# Patient Record
Sex: Female | Born: 1970 | Race: Black or African American | Hispanic: No | Marital: Married | Smoking: Never smoker
Health system: Southern US, Community
[De-identification: ages and names within clinical notes are randomized; demographics above are authoritative.]

## PROBLEM LIST (undated history)

## (undated) DIAGNOSIS — F419 Anxiety disorder, unspecified: Secondary | ICD-10-CM

## (undated) DIAGNOSIS — E079 Disorder of thyroid, unspecified: Secondary | ICD-10-CM

## (undated) DIAGNOSIS — G459 Transient cerebral ischemic attack, unspecified: Secondary | ICD-10-CM

---

## 2012-08-07 ENCOUNTER — Ambulatory Visit (INDEPENDENT_AMBULATORY_CARE_PROVIDER_SITE_OTHER): Payer: BC Managed Care – PPO | Admitting: Obstetrics and Gynecology

## 2012-08-07 ENCOUNTER — Encounter: Payer: Self-pay | Admitting: Obstetrics and Gynecology

## 2012-08-07 VITALS — BP 120/84 | HR 64 | Ht 71.0 in | Wt 190.0 lb

## 2012-08-07 DIAGNOSIS — Z124 Encounter for screening for malignant neoplasm of cervix: Secondary | ICD-10-CM

## 2012-08-07 DIAGNOSIS — A499 Bacterial infection, unspecified: Secondary | ICD-10-CM

## 2012-08-07 DIAGNOSIS — N760 Acute vaginitis: Secondary | ICD-10-CM

## 2012-08-07 DIAGNOSIS — Z01419 Encounter for gynecological examination (general) (routine) without abnormal findings: Secondary | ICD-10-CM

## 2012-08-07 MED ORDER — METRONIDAZOLE 0.75 % VA GEL: 1.0000 | Freq: Every day | VAGINAL | Status: DC

## 2012-08-07 NOTE — Progress Notes (Signed)
ANNUAL GYNECOLOGIC EXAMINATION   Virginia Griffith is a 41 y.o. female, G8P3, who presents for an annual exam. She has a history of recurrent vaginosis.    History   Social History  . Marital Status: Married    Spouse Name: N/A    Number of Children: N/A  . Years of Education: N/A   Social History Main Topics  . Smoking status: Never Smoker   . Smokeless tobacco: None  . Alcohol Use: Yes     wine socially   . Drug Use: No  . Sexually Active: None   Other Topics Concern  . None   Social History Narrative  . None    Menstrual cycle:   LMP: Patient's last menstrual period was 07/23/2012.             The following portions of the patient's history were reviewed and updated as appropriate: allergies, current medications, past family history, past medical history, past social history, past surgical history and problem list.  Review of Systems Pertinent items are noted in HPI. Breast:Negative for breast lump,nipple discharge or nipple retraction Gastrointestinal: Negative for abdominal pain, change in bowel habits or rectal bleeding Urinary:negative   Objective:    BP 120/84  Pulse 64  Ht 5\' 11"  (1.803 m)  Wt 190 lb (86.183 kg)  BMI 26.50 kg/m2  LMP 07/23/2012    Weight:  Wt Readings from Last 1 Encounters:  08/07/12 190 lb (86.183 kg)          BMI: Body mass index is 26.50 kg/(m^2).  General Appearance: Alert, appropriate appearance for age. No acute distress HEENT: Grossly normal Neck / Thyroid: Supple, no masses, nodes or enlargement Lungs: clear to auscultation bilaterally Back: No CVA tenderness Breast Exam: No masses or nodes.No dimpling, nipple retraction or discharge. Cardiovascular: Regular rate and rhythm. S1, S2, no murmur Gastrointestinal: Soft, non-tender, no masses or organomegaly  ++++++++++++++++++++++++++++++++++++++++++++++++++++++++  Pelvic Exam: External genitalia: normal general appearance Vaginal: normal without tenderness, induration or  masses. Relaxation: Yes Cervix: normal appearance Adnexa: normal bimanual exam Uterus: normal size, shape, and consistency Rectovaginal: normal rectal, no masses  ++++++++++++++++++++++++++++++++++++++++++++++++++++++++  Lymphatic Exam: Non-palpable nodes in neck, clavicular, axillary, or inguinal regions Neurologic: Normal speech, no tremor  Psychiatric: Alert and oriented, appropriate affect.   Wet prep: Negative  Assessment:    Normal gyn exam   Overweight or obese: Yes   Pelvic relaxation: Yes  Recurrent vaginosis   Plan:    mammogram pap smear return annually or prn Contraception:no method    Medications prescribed: MetroGel vaginal one applicator each night for 5 nights as needed around the time of her menstrual cycle  STD screen request: No   The updated Pap smear screening guidelines were discussed with the patient. The patient requested that I obtain a Pap smear: Yes.  Kegel exercises discussed: Yes.  Proper diet and regular exercise were reviewed.  Annual mammograms recommended starting at age 27. Proper breast care was discussed.  Screening colonoscopy is recommended beginning at age 21.  Regular health maintenance was reviewed.  Sleep hygiene was discussed.  Adequate calcium and vitamin D intake was emphasized.  Leonard Schwartz M.D.    Regular Periods: yes, odor after cycle is over  Mammogram: no  Monthly Breast Ex.: yes Exercise: not since injury  Tetanus < 10 years: no unsure Seatbelts: yes  NI. Bladder Functn.: yes Abuse at home: no  Daily BM's: no Stressful Work: yes sometimes  Healthy Diet: yes Sigmoid-Colonoscopy: n/a  Calcium: no  Medical problems this year: dislocated ankle, torn ligaments   LAST PAP: 01/24/10  Contraception: none  Mammogram:  Pt has not had one  PCP:  none  PMH:  none  FMH: none  Last Bone Scan: n/a

## 2012-08-08 LAB — PAP IG W/ RFLX HPV ASCU

## 2012-11-29 ENCOUNTER — Other Ambulatory Visit: Payer: Self-pay

## 2013-08-20 ENCOUNTER — Other Ambulatory Visit: Payer: Self-pay

## 2014-08-16 ENCOUNTER — Encounter: Payer: Self-pay | Admitting: Obstetrics and Gynecology

## 2014-09-20 ENCOUNTER — Emergency Department (HOSPITAL_COMMUNITY)
Admission: EM | Admit: 2014-09-20 | Discharge: 2014-09-20 | Disposition: A | Discharge status: Home / Self Care | Payer: BC Managed Care – PPO | Attending: Emergency Medicine | Admitting: Emergency Medicine

## 2014-09-20 ENCOUNTER — Encounter (HOSPITAL_COMMUNITY): Payer: Self-pay | Admitting: *Deleted

## 2014-09-20 DIAGNOSIS — Z91040 Latex allergy status: Secondary | ICD-10-CM | POA: Insufficient documentation

## 2014-09-20 DIAGNOSIS — F410 Panic disorder [episodic paroxysmal anxiety] without agoraphobia: Secondary | ICD-10-CM | POA: Insufficient documentation

## 2014-09-20 HISTORY — DX: Anxiety disorder, unspecified: F41.9

## 2014-09-20 MED ORDER — LORAZEPAM 1 MG PO TABS
1.0000 mg | ORAL_TABLET | Freq: Once | ORAL | Status: AC
  Administered 2014-09-20: 1 mg via ORAL
  Filled 2014-09-20: qty 1

## 2014-09-20 NOTE — Discharge Instructions (Signed)
Panic Attacks °Panic attacks are sudden, short-lived surges of severe anxiety, fear, or discomfort. They may occur for no reason when you are relaxed, when you are anxious, or when you are sleeping. Panic attacks may occur for a number of reasons:  °· Healthy people occasionally have panic attacks in extreme, life-threatening situations, such as war or natural disasters. Normal anxiety is a protective mechanism of the body that helps us react to danger (fight or flight response). °· Panic attacks are often seen with anxiety disorders, such as panic disorder, social anxiety disorder, generalized anxiety disorder, and phobias. Anxiety disorders cause excessive or uncontrollable anxiety. They may interfere with your relationships or other life activities. °· Panic attacks are sometimes seen with other mental illnesses, such as depression and posttraumatic stress disorder. °· Certain medical conditions, prescription medicines, and drugs of abuse can cause panic attacks. °SYMPTOMS  °Panic attacks start suddenly, peak within 20 minutes, and are accompanied by four or more of the following symptoms: °· Pounding heart or fast heart rate (palpitations). °· Sweating. °· Trembling or shaking. °· Shortness of breath or feeling smothered. °· Feeling choked. °· Chest pain or discomfort. °· Nausea or strange feeling in your stomach. °· Dizziness, light-headedness, or feeling like you will faint. °· Chills or hot flushes. °· Numbness or tingling in your lips or hands and feet. °· Feeling that things are not real or feeling that you are not yourself. °· Fear of losing control or going crazy. °· Fear of dying. °Some of these symptoms can mimic serious medical conditions. For example, you may think you are having a heart attack. Although panic attacks can be very scary, they are not life threatening. °DIAGNOSIS  °Panic attacks are diagnosed through an assessment by your health care provider. Your health care provider will ask  questions about your symptoms, such as where and when they occurred. Your health care provider will also ask about your medical history and use of alcohol and drugs, including prescription medicines. Your health care provider may order blood tests or other studies to rule out a serious medical condition. Your health care provider may refer you to a mental health professional for further evaluation. °TREATMENT  °· Most healthy people who have one or two panic attacks in an extreme, life-threatening situation will not require treatment. °· The treatment for panic attacks associated with anxiety disorders or other mental illness typically involves counseling with a mental health professional, medicine, or a combination of both. Your health care provider will help determine what treatment is best for you. °· Panic attacks due to physical illness usually go away with treatment of the illness. If prescription medicine is causing panic attacks, talk with your health care provider about stopping the medicine, decreasing the dose, or substituting another medicine. °· Panic attacks due to alcohol or drug abuse go away with abstinence. Some adults need professional help in order to stop drinking or using drugs. °HOME CARE INSTRUCTIONS  °· Take all medicines as directed by your health care provider.   °· Schedule and attend follow-up visits as directed by your health care provider. It is important to keep all your appointments. °SEEK MEDICAL CARE IF: °· You are not able to take your medicines as prescribed. °· Your symptoms do not improve or get worse. °SEEK IMMEDIATE MEDICAL CARE IF:  °· You experience panic attack symptoms that are different than your usual symptoms. °· You have serious thoughts about hurting yourself or others. °· You are taking medicine for panic attacks and   have a serious side effect. °MAKE SURE YOU: °· Understand these instructions. °· Will watch your condition. °· Will get help right away if you are not  doing well or get worse. °Document Released: 10/01/2005 Document Revised: 10/06/2013 Document Reviewed: 05/15/2013 °ExitCare® Patient Information ©2015 [employer]. This information is not intended to replace advice given to you by your health care provider. Make sure you discuss any questions you have with your health care provider. ° °Emergency Department Resource Guide °1) Find a Doctor and Pay Out of Pocket °Although you won't have to find out who is covered by your insurance plan, it is a good idea to ask around and get recommendations. You will then need to call the office and see if the doctor you have chosen will accept you as a new patient and what types of options they offer for patients who are self-pay. Some doctors offer discounts or will set up payment plans for their patients who do not have insurance, but you will need to ask so you aren't surprised when you get to your appointment. ° °2) Contact Your Local Health Department °Not all health departments have doctors that can see patients for sick visits, but many do, so it is worth a call to see if yours does. If you don't know where your local health department is, you can check in your phone book. The CDC also has a tool to help you locate your state's health department, and many state websites also have listings of all of their local health departments. ° °3) Find a Walk-in Clinic °If your illness is not likely to be very severe or complicated, you may want to try a walk in clinic. These are popping up all over the country in pharmacies, drugstores, and shopping centers. They're usually staffed by nurse practitioners or physician assistants that have been trained to treat common illnesses and complaints. They're usually fairly quick and inexpensive. However, if you have serious medical issues or chronic medical problems, these are probably not your best option. ° °No Primary Care Doctor: °- Call [facility] at  832-8000 - they can help you  locate a primary care doctor that  accepts your insurance, provides certain services, etc. °- Physician Referral Service- 1-800-533-3463 ° °Chronic Pain Problems: °Organization         Address  Phone   Notes  °[facility]  (336) 297-2271 Patients need to be referred by their primary care doctor.  ° °Medication Assistance: °Organization         Address  Phone   Notes  °[place] [facility] [address] °[place], [place] [address] (336) 641-8030 --Must be a resident of [place] °-- Must have NO insurance coverage whatsoever (no Medicaid/ Medicare, etc.) °-- The pt. MUST have a primary care doctor that directs their care regularly and follows them in the community °  °[facility]  (866) 331-1348   °[employer]  (888) 892-1162   ° °Agencies that provide inexpensive medical care: °Organization         Address  Phone   Notes  °[facility]  (336) 832-8035   °[facility]    (336) 832-7272   °[facility] [address] °[place], [place] [address] (336) 832-4777   °[facility] [address], °[place] (336) 271-4999   °[facility]    (336) 373-0678   °[facility]    (336) 272-1050   °  [facility] ° [address], [place] Phone:  (336) 832-4444, Fax:  (336) 832-4440 Hours of Operation:  9 am - 6 pm, M-F.  Also accepts Medicaid/Medicare and self-pay.  °[facility] ° [address], [place] Phone: (336) [phone], Fax: (336) [phone]. Hours of Operation:  8:30 am - 5:30 pm, M-F.  Also accepts Medicaid and self-pay.  °[facility] [place] [address], [place] Phone: (336) 878-6027   °[facility] [address], [place], [place] (336)723-1848, Ext. 123 Mondays & Thursdays: 7-9 AM.  First 15 patients are seen on a first come, first serve basis. °  ° °Medicaid-accepting [place]  Providers: ° °Organization         Address  Phone   Notes  °[facility] [address], [place] (336) 641-2100 Also accepts self-pay patients.  °[facility] [address], [place] ° (336) 856-9996   °[facility] [address], [place] (336) 288-8857   °[facility] [address] [place] [address], [place] (336) 299-7000   °Veita Bland [address], [place]  ° (336) 373-1557 Only accepts [place] Access Medicaid patients after they have their name applied to their card.  ° °Self-Pay (no insurance) in [place]: ° °Organization         Address  Phone   Notes  °Sickle Cell Patients, [facility] [address], [place] (336) 832-1970   °[facility] [address], [place] (336) 832-4400   °[facility] ° [address], [place] (336) 992-4800   °[facility]/Dr. Osei-Bonsu ° [address] [place] [address], [place] or [address], [place] (336) [phone] Phone number for both [place] and [place] locations is the same.  °[facility] [address], [place] (336) 299-0000   °[facility] [address] [place] [address], [place] or [address] (336) 852-7530 °(336) 878-2260   °[facility] [address], [place] (336) 350-1642, phone; (336) 294-5005, fax Sees patients 1st and 3rd Saturday of every month.  Must not qualify for public or private insurance (i.e. Medicaid, Medicare, [place] Health Choice, Veterans' Benefits) • Household income should be no more than 200% of the poverty level •The clinic cannot treat you if you are pregnant or think you are pregnant • Sexually transmitted diseases are not treated at the clinic.  ° ° °Dental Care: °Organization         Address  Phone  Notes  °[facility] [address], [place] (336) 641-6152 Accepts children up to age 21 who are enrolled in Medicaid or [place] Health Choice; pregnant women with a Medicaid card; and children who have applied for Medicaid or [place] Health Choice, but were declined, whose parents can pay a reduced fee at time of service.  °[facility] [place]  [address], [place] (336) 641-7733 Accepts children up to age 21 who are enrolled in Medicaid or [place] Health Choice; pregnant women with a Medicaid card; and children who have applied for Medicaid or [place] Health Choice, but were declined, whose parents can pay a reduced fee at time of service.  °[facility] ° [address], [place] (336) 641-4533 Patients are seen by appointment only. Walk-ins are not accepted. [facility] will see patients 18 years of age and older. °Monday - Tuesday (8am-5pm) °Most Wednesdays (8:30-5pm) °$30 per visit, cash only  °[facility] ° [address], [place] (336) 641-4533 Patients are seen by appointment only. Walk-ins are not accepted. [facility] will see patients 18 years of age and older. °One Wednesday Evening (Monthly: Volunteer Based).  $30 per visit, cash only  °[facility]  (919) 537-3737 for adults; Children under age 4, call Graduate Pediatric Dentistry at ([phone]) [phone]. Children aged 4-14, please call ([phone]) [phone] to request a pediatric application. ° Dental services are provided in all areas of dental care including fillings, crowns and bridges, complete and partial dentures, implants, gum treatment, root canals, and extractions. Preventive care is also provided. Treatment is provided to both adults and children. °Patients are selected via a lottery and there is often a waiting list. °  °[facility] [address], °[place] ° (336) 763-8833 [email or web address] °  °[facility]  [address], [place], [place] (336)723-1848, Ext. 123 Second and Fourth Thursday of each month, opens at 6:30 AM; Clinic ends at 9 AM.  Patients are seen on a first-come first-served basis, and a limited number are seen during each clinic.  ° °[facility] ° [address], [place], [place] (336) 723-7904   Eligibility Requirements °You must have lived in [place], [place], or [place] counties for at least the last three months. °  You cannot be eligible for state or federal sponsored healthcare insurance, including Veterans Administration, Medicaid, or Medicare. °  You generally cannot be eligible for healthcare insurance through your employer.  °  How to apply: °Eligibility screenings are held every Tuesday and Wednesday afternoon from 1:00 pm until 4:00 pm. You do not need an appointment for the interview!  °[facility] [address], [place], [place] 336-631-2330   °[facility]  336-342-8273   °[place] [facility]  336-703-3100   °[facility]  336-570-6415   ° °Behavioral Health Resources in the Community: °Intensive Outpatient Programs °Organization         Address  Phone  Notes  °[facility] [address], [place], [place] 336-878-6098   °[facility] [address], [place], [place] 336-832-9800   °[facility] [address], [place], [place] ° 336-882-2125   °[facility] [address],  °[place], [place] 1-800-853-5163 or 336-641-4981   °Substance Abuse Resources °Organization         Address  Phone  Notes  °[facility]  336-882-2125   °[facility]  336-784-9470   °[facility]  336-285-9073   °[facility]  336-845-3988   °Residential & Outpatient Substance Abuse Program  1-800-659-3381   °Psychological Services °Organization         Address  Phone  Notes  °[facility]  336- 832-9600     °[facility]  336- 378-7881   °[facility] [address], [place] 1-800-853-5163 or 336-641-4981   ° °Mobile Crisis Teams °Organization         Address  Phone  Notes  °[facility], Mobile Crisis Care Unit  1-877-626-1772   °Assertive °[facility] ° [address] , [place] 336-834-9664   °  Sharon DeEsch [address] °[place] 336-554-5454   ° °Self-Help/Support Groups °Organization         Address  Phone             Notes  °[facility] - variety of support groups  336- 373-1402 Call for more information  °Narcotics Anonymous (NA), [facility] [address], °[place]  2 meetings at this location  ° °Residential Treatment Programs °Organization         Address  Phone  Notes  °[facility] [address],    °[place]  1-866-801-8205   °[facility] ° [address], [place], [place] 704-293-8524   °[facility] [address], [place] 336-845-3988 Admissions: 8am-3pm M-F  °[facility] [address]    °[place], [place] 336-841-1104   °[facility] [address], [place], [place] 336-379-7146   °[facility] [address]  °[place], [place] 336-285-9073   °[facility] - Intensive Outpatient [address], [place], [place] 336-852-3033   °[facility] [address]  °[place], [place] 1-877-615-2722 or 336-784-9470   °[facility] [address] [place], [place] 336-227-7417 Accepts Medicaid  °[facility] [address]  °[place] 1-800-659-3381 Substance Abuse/Addiction Treatment  ° °[place] Behavioral Health Resources °Organization         Address  Phone  Notes  °[facility]  (888) 581-9988   °Julie Brannon, PhD [address] [place], [place]   (336) 349-5553 or (336) 951-0000    °[facility]   [address] °[place], [place] (336) 349-4454   °[facility] [address], [place], [place] (336) 342-8316 Insurance/Medicaid/sponsorship through [facility]  °[facility] [address]                                    [place], [place] (336) 342-8316 Therapy/tele-psych/case  °[facility] [address]  ° [place], [place] (336) 349-2233    °Dr. Arfeen  (336) 349-4544   °[facility] [place]  [employer] [place] [facility] 1) [address],  °2) [address], [place] °3)  [address], [place] (336) 349-3220 °(336) 342-7768 ° °(336) 342-8140   °[place] [facility] (336) 342-1394 or (336) 342-3537 (After Hours)    ° ° ° °

## 2014-09-20 NOTE — ED Provider Notes (Signed)
CSN: [id number]     Arrival date & time 09/20/14  [id number] History   First MD Initiated Contact with Patient 09/20/14 0243     Chief Complaint  Patient presents with  . Panic Attack     (Consider location/radiation/quality/duration/timing/severity/associated sxs/prior Treatment) Patient is a 43 y.o. female presenting with shortness of breath.  Shortness of Breath Severity:  Severe Onset quality:  Sudden Duration:  1 hour Timing:  Constant Progression:  Resolved Chronicity:  Recurrent Context: emotional upset (recent stress)   Relieved by:  Nothing Worsened by:  Nothing tried Associated symptoms: no abdominal pain, no chest pain, no cough, no fever, no hemoptysis and no vomiting     Past Medical History  Diagnosis Date  . Anxiety    History reviewed. No pertinent past surgical history. History reviewed. No pertinent family history. History  Substance Use Topics  . Smoking status: Never Smoker   . Smokeless tobacco: Not on file  . Alcohol Use: Yes     Comment: wine socially    OB History    Gravida Para Term Preterm AB TAB SAB Ectopic Multiple Living   8 3        3      Review of Systems  Constitutional: Negative for fever.  Respiratory: Positive for shortness of breath. Negative for cough and hemoptysis.   Cardiovascular: Negative for chest pain.  Gastrointestinal: Negative for vomiting and abdominal pain.  All other systems reviewed and are negative.     Allergies  Latex  Home Medications   Prior to Admission medications   Medication Sig Start Date End Date Taking? Authorizing Provider  metroNIDAZOLE (METROGEL VAGINAL) 0.75 % vaginal gel Place 1 Applicatorful vaginally daily. Patient not taking: Reported on 09/20/2014 08/07/12   Kirkland HunArthur Stringer, MD   BP 135/95 mmHg  Pulse 86  Resp 20  Ht 5\' 11"  (1.803 m)  Wt 185 lb (83.915 kg)  BMI 25.81 kg/m2  SpO2 100%  LMP 09/15/2014 Physical Exam  Constitutional: She is oriented to person, place, and time. She  appears well-developed and well-nourished.  HENT:  Head: Normocephalic and atraumatic.  Right Ear: External ear normal.  Left Ear: External ear normal.  Eyes: Conjunctivae and EOM are normal. Pupils are equal, round, and reactive to light.  Neck: Normal range of motion. Neck supple.  Cardiovascular: Normal rate, regular rhythm, normal heart sounds and intact distal pulses.   Pulmonary/Chest: Effort normal and breath sounds normal.  Abdominal: Soft. Bowel sounds are normal. There is no tenderness.  Musculoskeletal: Normal range of motion.  Neurological: She is alert and oriented to person, place, and time.  Skin: Skin is warm and dry.  Vitals reviewed.   ED Course  Procedures (including critical care time) Labs Review Labs Reviewed - No data to display  Imaging Review No results found.   EKG Interpretation None      MDM   Final diagnoses:  Panic attack    43 y.o. female with pertinent PMH of panic attacks presents with what she describes as a panic attack. Patient states symptoms are identical. She has recent increased life stressors, as well as starting a lemonade diet which involves numerous liquids, no solid food. On arrival the patient has vital signs and physical exam as above which are unremarkable. She has no symptoms. I repeatedly discussed if there is any difference between prior panic attacks, any other concerning historical features for other pathology. The patient repeatedly denied any new symptoms. We discussed that if she were feeling poorly  from her diet, she should discontinue this. Discussed obtaining labs, however we shared decision-making agreed that since patient had identical symptoms to a panic attack and no persistent symptoms, did not feel further workup warranted. The patient will follow up with her primary care doctor. She was given Ativan prior to discharge..    1. Panic attack         Mirian MoMatthew Gentry, MD 09/20/14 [phone]

## 2014-09-20 NOTE — ED Notes (Signed)
Pt in stating that she woke up feeling anxious, intermittent panic attacks, history of same, also states she has been on a "lemonade" diet for three days and that may be contributing to symptoms, states she has a lot going on right now that is causing stress

## 2014-12-15 ENCOUNTER — Encounter (HOSPITAL_COMMUNITY): Payer: Self-pay | Admitting: Emergency Medicine

## 2014-12-15 ENCOUNTER — Emergency Department (HOSPITAL_COMMUNITY): Payer: BC Managed Care – PPO

## 2014-12-15 ENCOUNTER — Emergency Department (HOSPITAL_COMMUNITY)
Admission: EM | Admit: 2014-12-15 | Discharge: 2014-12-15 | Disposition: A | Discharge status: Home / Self Care | Payer: BC Managed Care – PPO | Attending: Emergency Medicine | Admitting: Emergency Medicine

## 2014-12-15 DIAGNOSIS — S161XXA Strain of muscle, fascia and tendon at neck level, initial encounter: Secondary | ICD-10-CM | POA: Diagnosis not present

## 2014-12-15 DIAGNOSIS — Z792 Long term (current) use of antibiotics: Secondary | ICD-10-CM | POA: Insufficient documentation

## 2014-12-15 DIAGNOSIS — S8002XA Contusion of left knee, initial encounter: Secondary | ICD-10-CM | POA: Insufficient documentation

## 2014-12-15 DIAGNOSIS — Z8659 Personal history of other mental and behavioral disorders: Secondary | ICD-10-CM | POA: Insufficient documentation

## 2014-12-15 DIAGNOSIS — S8001XA Contusion of right knee, initial encounter: Secondary | ICD-10-CM | POA: Diagnosis not present

## 2014-12-15 DIAGNOSIS — Y92410 Unspecified street and highway as the place of occurrence of the external cause: Secondary | ICD-10-CM | POA: Insufficient documentation

## 2014-12-15 DIAGNOSIS — Y9389 Activity, other specified: Secondary | ICD-10-CM | POA: Insufficient documentation

## 2014-12-15 DIAGNOSIS — S8000XA Contusion of unspecified knee, initial encounter: Secondary | ICD-10-CM

## 2014-12-15 DIAGNOSIS — S8010XA Contusion of unspecified lower leg, initial encounter: Secondary | ICD-10-CM

## 2014-12-15 DIAGNOSIS — Y998 Other external cause status: Secondary | ICD-10-CM | POA: Insufficient documentation

## 2014-12-15 DIAGNOSIS — S39012A Strain of muscle, fascia and tendon of lower back, initial encounter: Secondary | ICD-10-CM | POA: Diagnosis not present

## 2014-12-15 DIAGNOSIS — Z91040 Latex allergy status: Secondary | ICD-10-CM | POA: Diagnosis not present

## 2014-12-15 DIAGNOSIS — S199XXA Unspecified injury of neck, initial encounter: Secondary | ICD-10-CM | POA: Diagnosis present

## 2014-12-15 MED ORDER — IBUPROFEN 800 MG PO TABS: 800.0000 mg | ORAL_TABLET | Freq: Three times a day (TID) | ORAL | Status: DC | PRN

## 2014-12-15 MED ORDER — CYCLOBENZAPRINE HCL 10 MG PO TABS: 10.0000 mg | ORAL_TABLET | Freq: Three times a day (TID) | ORAL | Status: DC | PRN

## 2014-12-15 MED ORDER — HYDROCODONE-ACETAMINOPHEN 5-325 MG PO TABS: 1.0000 | ORAL_TABLET | Freq: Four times a day (QID) | ORAL | Status: DC | PRN
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
## 2014-12-15 NOTE — Discharge Instructions (Signed)
Your x-rays here today were normal.  Follow-up with a primary care doctor.  Use ice and heat on the areas that are sore
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
## 2015-07-28 ENCOUNTER — Emergency Department (HOSPITAL_COMMUNITY): Payer: BC Managed Care – PPO

## 2015-07-28 ENCOUNTER — Emergency Department (HOSPITAL_COMMUNITY)
Admission: EM | Admit: 2015-07-28 | Discharge: 2015-07-28 | Disposition: A | Discharge status: Home / Self Care | Payer: BC Managed Care – PPO | Attending: Emergency Medicine | Admitting: Emergency Medicine

## 2015-07-28 ENCOUNTER — Encounter (HOSPITAL_COMMUNITY): Payer: Self-pay | Admitting: Emergency Medicine

## 2015-07-28 DIAGNOSIS — Z91040 Latex allergy status: Secondary | ICD-10-CM | POA: Insufficient documentation

## 2015-07-28 DIAGNOSIS — Y9371 Activity, boxing: Secondary | ICD-10-CM | POA: Diagnosis not present

## 2015-07-28 DIAGNOSIS — Y9289 Other specified places as the place of occurrence of the external cause: Secondary | ICD-10-CM | POA: Insufficient documentation

## 2015-07-28 DIAGNOSIS — X58XXXA Exposure to other specified factors, initial encounter: Secondary | ICD-10-CM | POA: Insufficient documentation

## 2015-07-28 DIAGNOSIS — S93401A Sprain of unspecified ligament of right ankle, initial encounter: Secondary | ICD-10-CM | POA: Diagnosis not present

## 2015-07-28 DIAGNOSIS — Z8659 Personal history of other mental and behavioral disorders: Secondary | ICD-10-CM | POA: Insufficient documentation

## 2015-07-28 DIAGNOSIS — S99911A Unspecified injury of right ankle, initial encounter: Secondary | ICD-10-CM | POA: Diagnosis present

## 2015-07-28 DIAGNOSIS — Y999 Unspecified external cause status: Secondary | ICD-10-CM | POA: Diagnosis not present

## 2015-07-28 DIAGNOSIS — Z87828 Personal history of other (healed) physical injury and trauma: Secondary | ICD-10-CM | POA: Insufficient documentation

## 2015-07-28 NOTE — ED Notes (Signed)
MD at bedside. 

## 2015-07-28 NOTE — ED Notes (Signed)
C/o R ankle pain and swelling after doing a lot of kicking at martial arts class tonight. ASO in place on arrival.

## 2015-07-28 NOTE — ED Provider Notes (Signed)
CSN: [id number]     Arrival date & time 07/28/15  0201 History  By signing my name below, I, Doreatha MartinEva Mathews, attest that this documentation has been prepared under the direction and in the presence of Rolland PorterMark Kit Brubacher, MD. Electronically Signed: Doreatha MartinEva Mathews, ED Scribe. 07/28/2015. 2:51 AM.    Chief Complaint  Patient presents with  . Ankle Pain   The history is provided by the patient. No language interpreter was used.    HPI Comments: Virginia Griffith is a 44 y.o. female who presents to the Emergency Department complaining of moderate right ankle pain and swelling onset tonight after doing a lot of kicking at a kickboxing class. She states that she has been taking the class for a week and has experience with martial arts. Pt notes hx of dislocation of the same ankle. Pt states she is limping secondary to pain. She notes that pain is worsened with movement or weight bearing. Pt notes that she did not feel any pain while kicking. Pt took aleve an hour ago with moderate relief. Pt denies any acute trauma, known hyperextension.    Past Medical History  Diagnosis Date  . Anxiety    Past Surgical History  Procedure Laterality Date  . Cesarean section     No family history on file. Social History  Substance Use Topics  . Smoking status: Never Smoker   . Smokeless tobacco: None  . Alcohol Use: Yes     Comment: wine socially    OB History    Gravida Para Term Preterm AB TAB SAB Ectopic Multiple Living   8 3        3      Review of Systems  Constitutional: Negative for fever, chills, diaphoresis, appetite change and fatigue.  HENT: Negative for mouth sores, sore throat and trouble swallowing.   Eyes: Negative for visual disturbance.  Respiratory: Negative for cough, chest tightness, shortness of breath and wheezing.   Cardiovascular: Negative for chest pain.  Gastrointestinal: Negative for nausea, vomiting, abdominal pain, diarrhea and abdominal distention.  Endocrine: Negative for polydipsia,  polyphagia and polyuria.  Genitourinary: Negative for dysuria, frequency and hematuria.  Musculoskeletal: Positive for joint swelling and arthralgias. Negative for gait problem.  Skin: Negative for color change, pallor and rash.  Neurological: Negative for dizziness, syncope, light-headedness and headaches.  Hematological: Does not bruise/bleed easily.  Psychiatric/Behavioral: Negative for behavioral problems and confusion.   Allergies  Latex  Home Medications   Prior to Admission medications   Medication Sig Start Date End Date Taking? Authorizing Provider  Naproxen Sodium (ALEVE PO) Take 1-2 tablets by mouth every 12 (twelve) hours as needed (pain).   Yes Historical Provider, MD  cyclobenzaprine (FLEXERIL) 10 MG tablet Take 1 tablet (10 mg total) by mouth 3 (three) times daily as needed for muscle spasms. Patient not taking: Reported on 07/28/2015 12/15/14   Charlestine Nighthristopher Lawyer, PA-C  HYDROcodone-acetaminophen (NORCO/VICODIN) 5-325 MG per tablet Take 1 tablet by mouth every 6 (six) hours as needed for moderate pain. Patient not taking: Reported on 07/28/2015 12/15/14   Charlestine Nighthristopher Lawyer, PA-C  ibuprofen (ADVIL,MOTRIN) 800 MG tablet Take 1 tablet (800 mg total) by mouth every 8 (eight) hours as needed. Patient not taking: Reported on 07/28/2015 12/15/14   Charlestine Nighthristopher Lawyer, PA-C  metroNIDAZOLE (METROGEL VAGINAL) 0.75 % vaginal gel Place 1 Applicatorful vaginally daily. Patient not taking: Reported on 09/20/2014 08/07/12   Kirkland HunArthur Stringer, MD   BP 120/81 mmHg  Pulse 64  Temp(Src) 97.5 F (36.4 C) (Oral)  Resp 20  Ht  (1.803 m)  SpO2 99%  LMP 07/21/2015 Physical Exam  Constitutional: She is oriented to person, place, and time. She appears well-developed and well-nourished. No distress.  HENT:  Head: Normocephalic.  Eyes: Conjunctivae are normal. Pupils are equal, round, and reactive to light. No scleral icterus.  Neck: Normal range of motion. Neck supple. No thyromegaly present.   Cardiovascular: Normal rate and regular rhythm.  Exam reveals no gallop and no friction rub.   No murmur heard. Pulmonary/Chest: Effort normal and breath sounds normal. No respiratory distress. She has no wheezes. She has no rales.  Abdominal: Soft. Bowel sounds are normal. She exhibits no distension. There is no tenderness. There is no rebound.  Musculoskeletal: Normal range of motion. She exhibits tenderness.  Tenderness of the table tibial joint with joint effusion. No direct bony tenderness.   Neurological: She is alert and oriented to person, place, and time.  Skin: Skin is warm and dry. No rash noted.  Psychiatric: She has a normal mood and affect. Her behavior is normal.  Nursing note and vitals reviewed.  ED Course  Procedures (including critical care time) DIAGNOSTIC STUDIES: Oxygen Saturation is 99% on RA, normal by my interpretation.    COORDINATION OF CARE: 2:48 AM Discussed treatment plan with pt at bedside and pt agreed to plan.   Imaging Review Dg Ankle Complete Right  07/28/2015  CLINICAL DATA:  Pain and swelling around the lateral aspect of the ankle after injury kick boxing last night. EXAM: RIGHT ANKLE - COMPLETE 3+ VIEW COMPARISON:  None. FINDINGS: Right ankle appears intact. No evidence of acute fracture or subluxation. No focal bone lesion or bone destruction. Bone cortex and trabecular architecture appear intact. No radiopaque soft tissue foreign bodies. Soft tissue calcifications likely represent phleboliths. IMPRESSION: No acute bony abnormalities. Electronically Signed   By: Burman Nieves M.D.   On: 07/28/2015 02:38   I have personally reviewed and evaluated these images as part of my medical decision-making.  MDM   Final diagnoses:  Ankle sprain, right, initial encounter    I personally performed the services described in this documentation, which was scribed in my presence. The recorded information has been reviewed and is accurate.   Rolland Porter,  MD 07/28/15 [phone]

## 2015-07-28 NOTE — Discharge Instructions (Signed)
Ankle Sprain  An ankle sprain is an injury to the strong, fibrous tissues (ligaments) that hold the bones of your ankle joint together.   CAUSES  An ankle sprain is usually caused by a fall or by twisting your ankle. Ankle sprains most commonly occur when you step on the outer edge of your foot, and your ankle turns inward. People who participate in sports are more prone to these types of injuries.   SYMPTOMS    Pain in your ankle. The pain may be present at rest or only when you are trying to stand or walk.   Swelling.   Bruising. Bruising may develop immediately or within 1 to 2 days after your injury.   Difficulty standing or walking, particularly when turning corners or changing directions.  DIAGNOSIS   Your caregiver will ask you details about your injury and perform a physical exam of your ankle to determine if you have an ankle sprain. During the physical exam, your caregiver will press on and apply pressure to specific areas of your foot and ankle. Your caregiver will try to move your ankle in certain ways. An X-ray exam may be done to be sure a bone was not broken or a ligament did not separate from one of the bones in your ankle (avulsion fracture).   TREATMENT   Certain types of braces can help stabilize your ankle. Your caregiver can make a recommendation for this. Your caregiver may recommend the use of medicine for pain. If your sprain is severe, your caregiver may refer you to a surgeon who helps to restore function to parts of your skeletal system (orthopedist) or a physical therapist.  HOME CARE INSTRUCTIONS    Apply ice to your injury for 1-2 days or as directed by your caregiver. Applying ice helps to reduce inflammation and pain.    Put ice in a plastic bag.    Place a towel between your skin and the bag.    Leave the ice on for 15-20 minutes at a time, every 2 hours while you are awake.   Only take over-the-counter or prescription medicines for pain, discomfort, or fever as directed by  your caregiver.   Elevate your injured ankle above the level of your heart as much as possible for 2-3 days.   If your caregiver recommends crutches, use them as instructed. Gradually put weight on the affected ankle. Continue to use crutches or a cane until you can walk without feeling pain in your ankle.   If you have a plaster splint, wear the splint as directed by your caregiver. Do not rest it on anything harder than a pillow for the first 24 hours. Do not put weight on it. Do not get it wet. You may take it off to take a shower or bath.   You may have been given an elastic bandage to wear around your ankle to provide support. If the elastic bandage is too tight (you have numbness or tingling in your foot or your foot becomes cold and blue), adjust the bandage to make it comfortable.   If you have an air splint, you may blow more air into it or let air out to make it more comfortable. You may take your splint off at night and before taking a shower or bath. Wiggle your toes in the splint several times per day to decrease swelling.  SEEK MEDICAL CARE IF:    You have rapidly increasing bruising or swelling.   Your toes feel   extremely cold or you lose feeling in your foot.   Your pain is not relieved with medicine.  SEEK IMMEDIATE MEDICAL CARE IF:   Your toes are numb or blue.   You have severe pain that is increasing.  MAKE SURE YOU:    Understand these instructions.   Will watch your condition.   Will get help right away if you are not doing well or get worse.     This information is not intended to replace advice given to you by your health care provider. Make sure you discuss any questions you have with your health care provider.     Document Released: 10/01/2005 Document Revised: 10/22/2014 Document Reviewed: 10/13/2011  [employer]vier Interactive Patient Education 2016 [employer]

## 2015-07-28 NOTE — ED Notes (Signed)
Patient transported to X-ray 

## 2016-07-25 ENCOUNTER — Emergency Department: Payer: BC Managed Care – PPO

## 2016-07-25 ENCOUNTER — Encounter: Payer: Self-pay | Admitting: *Deleted

## 2016-07-25 ENCOUNTER — Observation Stay
Admission: EM | Admit: 2016-07-25 | Discharge: 2016-07-26 | Disposition: A | Discharge status: Home / Self Care | Payer: BC Managed Care – PPO | Attending: Family Medicine | Admitting: Family Medicine

## 2016-07-25 DIAGNOSIS — G459 Transient cerebral ischemic attack, unspecified: Principal | ICD-10-CM | POA: Diagnosis present

## 2016-07-25 DIAGNOSIS — E785 Hyperlipidemia, unspecified: Secondary | ICD-10-CM | POA: Diagnosis not present

## 2016-07-25 DIAGNOSIS — Z820 Family history of epilepsy and other diseases of the nervous system: Secondary | ICD-10-CM | POA: Insufficient documentation

## 2016-07-25 DIAGNOSIS — I080 Rheumatic disorders of both mitral and aortic valves: Secondary | ICD-10-CM | POA: Diagnosis not present

## 2016-07-25 DIAGNOSIS — I639 Cerebral infarction, unspecified: Secondary | ICD-10-CM | POA: Diagnosis present

## 2016-07-25 DIAGNOSIS — Z7982 Long term (current) use of aspirin: Secondary | ICD-10-CM | POA: Insufficient documentation

## 2016-07-25 DIAGNOSIS — Z8249 Family history of ischemic heart disease and other diseases of the circulatory system: Secondary | ICD-10-CM | POA: Insufficient documentation

## 2016-07-25 DIAGNOSIS — M6281 Muscle weakness (generalized): Secondary | ICD-10-CM

## 2016-07-25 DIAGNOSIS — Z91040 Latex allergy status: Secondary | ICD-10-CM | POA: Insufficient documentation

## 2016-07-25 DIAGNOSIS — F410 Panic disorder [episodic paroxysmal anxiety] without agoraphobia: Secondary | ICD-10-CM | POA: Diagnosis not present

## 2016-07-25 DIAGNOSIS — Z79899 Other long term (current) drug therapy: Secondary | ICD-10-CM | POA: Insufficient documentation

## 2016-07-25 LAB — CBC
HCT: 42.9 % (ref 35.0–47.0)
Hemoglobin: 14.4 g/dL (ref 12.0–16.0)
MCH: 28.5 pg (ref 26.0–34.0)
MCHC: 33.5 g/dL (ref 32.0–36.0)
MCV: 85.2 fL (ref 80.0–100.0)
Platelets: 191 10*3/uL (ref 150–440)
RBC: 5.04 MIL/uL (ref 3.80–5.20)
RDW: 15.3 % — ABNORMAL HIGH (ref 11.5–14.5)
WBC: 6 10*3/uL (ref 3.6–11.0)

## 2016-07-25 LAB — URINALYSIS COMPLETE WITH MICROSCOPIC (ARMC ONLY)
BACTERIA UA: NONE SEEN
BILIRUBIN URINE: NEGATIVE
Glucose, UA: NEGATIVE mg/dL
Ketones, ur: NEGATIVE mg/dL
LEUKOCYTES UA: NEGATIVE
Nitrite: NEGATIVE
PH: 5 (ref 5.0–8.0)
Protein, ur: NEGATIVE mg/dL
Specific Gravity, Urine: 1.019 (ref 1.005–1.030)

## 2016-07-25 LAB — BASIC METABOLIC PANEL
ANION GAP: 7 (ref 5–15)
BUN: 15 mg/dL (ref 6–20)
CHLORIDE: 104 mmol/L (ref 101–111)
CO2: 26 mmol/L (ref 22–32)
Calcium: 9.8 mg/dL (ref 8.9–10.3)
Creatinine, Ser: 1 mg/dL (ref 0.44–1.00)
GFR calc non Af Amer: >60 mL/min (ref >60)
Glucose, Bld: 106 mg/dL — ABNORMAL HIGH (ref 65–99)
POTASSIUM: 4.5 mmol/L (ref 3.5–5.1)
Sodium: 137 mmol/L (ref 135–145)

## 2016-07-25 LAB — POCT PREGNANCY, URINE: PREG TEST UR: NEGATIVE

## 2016-07-25 NOTE — ED Triage Notes (Addendum)
Pt ambulatory to triage.  Pt reports feeling weak since 10am.  No headache.  No slurred speech.  No dizziness.  No n/v/d.  Pt states left eye has been twitching for 3 weeks.  No eye twitching now.  Pt alert.  Speech clear.

## 2016-07-25 NOTE — ED Notes (Signed)
Dr. Derrill KayGoodman states no head ct scan at this time.

## 2016-07-25 NOTE — ED Provider Notes (Signed)
[facility] Emergency Department Provider Note  ____________________________________________  Time seen: Approximately 11:19 PM  I have reviewed the triage vital signs and the nursing notes.   HISTORY  Chief Complaint Weakness   HPI Virginia Griffith is a 45 y.o. female the history of anxiety who presents for evaluation of left-sided weakness and numbness. Patient reports that she was in a work meeting and 9:30 AM when she first noticed the symptoms. She reports persistent numbness on the left side of her face, left upper extremity, left lower extremity. Throughout the day she reports that she felt slight weakness on the left side as well. She also endorses mild difficulty finding words and gait instability mostly due to numbness on the LLE. She denies headache, chest pain, back pain, shortness of breath. She is not a smoker. No family history of stroke. No facial droop. Patient reports that the symptoms have been persistent since earlier this morning.   Past Medical History:  Diagnosis Date  . Anxiety     There are no active problems to display for this patient.   Past Surgical History:  Procedure Laterality Date  . CESAREAN SECTION      Prior to Admission medications   Medication Sig Start Date End Date Taking? Authorizing Provider  cyclobenzaprine (FLEXERIL) 10 MG tablet Take 1 tablet (10 mg total) by mouth 3 (three) times daily as needed for muscle spasms. Patient not taking: Reported on 07/28/2015 12/15/14   Charlestine Night, PA-C  HYDROcodone-acetaminophen (NORCO/VICODIN) 5-325 MG per tablet Take 1 tablet by mouth every 6 (six) hours as needed for moderate pain. Patient not taking: Reported on 07/28/2015 12/15/14   Charlestine Night, PA-C  ibuprofen (ADVIL,MOTRIN) 800 MG tablet Take 1 tablet (800 mg total) by mouth every 8 (eight) hours as needed. Patient not taking: Reported on 07/28/2015 12/15/14   Charlestine Night, PA-C  metroNIDAZOLE (METROGEL  VAGINAL) 0.75 % vaginal gel Place 1 Applicatorful vaginally daily. Patient not taking: Reported on 09/20/2014 08/07/12   Kirkland Hun, MD  Naproxen Sodium (ALEVE PO) Take 1-2 tablets by mouth every 12 (twelve) hours as needed (pain).    Historical Provider, MD    Allergies Latex  No family history on file.  Social History Social History  Substance Use Topics  . Smoking status: Never Smoker  . Smokeless tobacco: Never Used  . Alcohol use Yes     Comment: wine socially     Review of Systems  Constitutional: Negative for fever. Eyes: Negative for visual changes. ENT: Negative for sore throat. Cardiovascular: Negative for chest pain. Respiratory: Negative for shortness of breath. Gastrointestinal: Negative for abdominal pain, vomiting or diarrhea. Genitourinary: Negative for dysuria. Musculoskeletal: Negative for back pain. Skin: Negative for rash. Neurological: Negative for headaches. + L sided numbness, difficulty finding words  ____________________________________________   PHYSICAL EXAM:  VITAL SIGNS: ED Triage Vitals  Enc Vitals Group     BP 07/25/16 2027 (!) 145/92     Pulse Rate 07/25/16 2026 67     Resp 07/25/16 2026 18     Temp 07/25/16 2026 98.6 F (37 C)     Temp Source 07/25/16 2026 Oral     SpO2 07/25/16 2026 100 %     Weight 07/25/16 2027 170 lb (77.1 kg)     Height 07/25/16 2027 5\' 11"  (1.803 m)     Head Circumference --      Peak Flow --      Pain Score --  Pain Loc --      Pain Edu? --      Excl. in GC? --     Constitutional: Alert and oriented. Well appearing and in no apparent distress. HEENT:      Head: Normocephalic and atraumatic.         Eyes: Conjunctivae are normal. Sclera is non-icteric. EOMI. PERRL      Mouth/Throat: Mucous membranes are moist.       Neck: Supple with no signs of meningismus. Cardiovascular: Regular rate and rhythm. No murmurs, gallops, or rubs. 2+ symmetrical distal pulses are present in all extremities. No  JVD. Respiratory: Normal respiratory effort. Lungs are clear to auscultation bilaterally. No wheezes, crackles, or rhonchi.  Gastrointestinal: Soft, non tender, and non distended with positive bowel sounds. No rebound or guarding. Musculoskeletal: Nontender with normal range of motion in all extremities. No edema, cyanosis, or erythema of extremities. Neurologic: Normal speech and language. A & O x3, PERRL, no nystagmus, CN II-XII intact, motor testing reveals good tone and bulk throughout. There is no evidence of pronator drift or dysmetria. Muscle strength is 5/5 throughout. Deep tendon reflexes are 2+ throughout with downgoing toes. Sensory examination reveals decrease sensation on the L face, LUE and LLE with no extinction. Gait deferred Skin: Skin is warm, dry and intact. No rash noted. Psychiatric: Mood and affect are normal. Speech and behavior are normal.  ____________________________________________   LABS (all labs ordered are listed, but only abnormal results are displayed)  Labs Reviewed  BASIC METABOLIC PANEL - Abnormal; Notable for the following:       Result Value   Glucose, Bld 106 (*)    All other components within normal limits  CBC - Abnormal; Notable for the following:    RDW 15.3 (*)    All other components within normal limits  URINALYSIS COMPLETEWITH MICROSCOPIC ([facility] ONLY) - Abnormal; Notable for the following:    Color, Urine YELLOW (*)    APPearance CLEAR (*)    Hgb urine dipstick 1+ (*)    Squamous Epithelial / LPF 0-5 (*)    All other components within normal limits  POC URINE PREG, ED  POCT PREGNANCY, URINE   ____________________________________________  EKG  ED ECG REPORT I, Nita Sicklearolina Rishita Petron, the attending physician, personally viewed and interpreted this ECG.  Normal sinus rhythm, rate of 57, normal intervals, normal axis, no ST elevations or depressions.  ____________________________________________  RADIOLOGY  Head CT:   Negative ____________________________________________   PROCEDURES  Procedure(s) performed: None Procedures Critical Care performed:  None ____________________________________________   INITIAL IMPRESSION / ASSESSMENT AND PLAN / ED COURSE  45 y.o. female the history of anxiety who presents for evaluation of left-sided weakness and numbness since 9:30 AM today. Patient's neurological exam reveals decreased sensation to light touch to the left face, left upper lower extremities with no extinction. Otherwise exam is within normal limits. NIHSS 1. Plan for head CT and if negative will give full dose ASA and admit for MRI and neuro evaluation. Patient outside tPA window.  NIH Stroke Scale  Interval: Baseline Time: 12:43 AM Person Administering Scale: [place]  Administer stroke scale items in the order listed. Record performance in each category after each subscale exam. Do not go back and change scores. Follow directions provided for each exam technique. Scores should reflect what the patient does, not what the clinician thinks the patient can do. The clinician should record answers while administering the exam and work quickly. Except where indicated, the patient should  not be coached (i.e., repeated requests to patient to make a special effort).   1a  Level of consciousness: 0=alert; keenly responsive  1b. LOC questions:  0=Performs both tasks correctly  1c. LOC commands: 0=Performs both tasks correctly  2.  Best Gaze: 0=normal  3.  Visual: 0=No visual loss  4. Facial Palsy: 0=Normal symmetric movement  5a.  Motor left arm: 0=No drift, limb holds 90 (or 45) degrees for full 10 seconds  5b.  Motor right arm: 0=No drift, limb holds 90 (or 45) degrees for full 10 seconds  6a. motor left leg: 0=No drift, limb holds 90 (or 45) degrees for full 10 seconds  6b  Motor right leg:  0=No drift, limb holds 90 (or 45) degrees for full 10 seconds  7. Limb Ataxia: 0=Absent  8.  Sensory:  1=Mild to moderate sensory loss; patient feels pinprick is less sharp or is dull on the affected side; there is a loss of superficial pain with pinprick but patient is aware She is being touched  9. Best Language:  0=No aphasia, normal  10. Dysarthria: 0=Normal  11. Extinction and Inattention: 0=No abnormality   Total:   1     Clinical Course  Comment By Time  CT negative. Patient given ASA. Will admit for MRI and neuro eval. Nita Sickle, MD 10/12 [phone]    Pertinent labs & imaging results that were available during my care of the patient were reviewed by me and considered in my medical decision making (see chart for details).    ____________________________________________   FINAL CLINICAL IMPRESSION(S) / ED DIAGNOSES  Final diagnoses:  Cerebrovascular accident (CVA), unspecified mechanism (HCC)      NEW MEDICATIONS STARTED DURING THIS VISIT:  New Prescriptions   No medications on file     Note:  This document was prepared using Dragon voice recognition software and may include unintentional dictation errors.    Nita Sickle, MD 07/26/16 [phone]

## 2016-07-26 ENCOUNTER — Observation Stay (HOSPITAL_COMMUNITY): Payer: BC Managed Care – PPO

## 2016-07-26 ENCOUNTER — Observation Stay (HOSPITAL_BASED_OUTPATIENT_CLINIC_OR_DEPARTMENT_OTHER)
Admit: 2016-07-26 | Discharge: 2016-07-26 | Disposition: A | Discharge status: Home / Self Care | Payer: BC Managed Care – PPO | Attending: Family Medicine | Admitting: Family Medicine

## 2016-07-26 ENCOUNTER — Observation Stay: Payer: BC Managed Care – PPO

## 2016-07-26 DIAGNOSIS — G8194 Hemiplegia, unspecified affecting left nondominant side: Secondary | ICD-10-CM

## 2016-07-26 DIAGNOSIS — G459 Transient cerebral ischemic attack, unspecified: Secondary | ICD-10-CM

## 2016-07-26 LAB — LIPID PANEL
CHOL/HDL RATIO: 2.8 ratio
CHOLESTEROL: 210 mg/dL — AB (ref 0–200)
HDL: 75 mg/dL (ref >40)
LDL Cholesterol: 123 mg/dL — ABNORMAL HIGH (ref 0–99)
Triglycerides: 61 mg/dL (ref <150)
VLDL: 12 mg/dL (ref 0–40)

## 2016-07-26 LAB — URINE DRUG SCREEN, QUALITATIVE (ARMC ONLY)
AMPHETAMINES, UR SCREEN: NOT DETECTED
BARBITURATES, UR SCREEN: NOT DETECTED
BENZODIAZEPINE, UR SCRN: NOT DETECTED
Cannabinoid 50 Ng, Ur ~~LOC~~: NOT DETECTED
Cocaine Metabolite,Ur ~~LOC~~: NOT DETECTED
MDMA (Ecstasy)Ur Screen: NOT DETECTED
METHADONE SCREEN, URINE: NOT DETECTED
OPIATE, UR SCREEN: NOT DETECTED
Phencyclidine (PCP) Ur S: NOT DETECTED
Tricyclic, Ur Screen: NOT DETECTED

## 2016-07-26 LAB — ECHOCARDIOGRAM COMPLETE
HEIGHTINCHES: 71 in
WEIGHTICAEL: 2907.2 [oz_av]

## 2016-07-26 LAB — TROPONIN I

## 2016-07-26 MED ORDER — ASPIRIN 81 MG PO CHEW
324.0000 mg | CHEWABLE_TABLET | Freq: Once | ORAL | Status: AC
  Administered 2016-07-26: 324 mg via ORAL
  Filled 2016-07-26: qty 4

## 2016-07-26 MED ORDER — ASPIRIN EC 81 MG PO TBEC
81.0000 mg | DELAYED_RELEASE_TABLET | Freq: Every day | ORAL | Status: DC
  Administered 2016-07-26: 81 mg via ORAL
  Filled 2016-07-26: qty 1

## 2016-07-26 MED ORDER — STROKE: EARLY STAGES OF RECOVERY BOOK
Freq: Once | Status: AC
Start: 2016-07-26 — End: 2016-07-26
  Administered 2016-07-26: 03:00:00

## 2016-07-26 MED ORDER — ENOXAPARIN SODIUM 40 MG/0.4ML ~~LOC~~ SOLN: 40.0000 mg | Freq: Every day | SUBCUTANEOUS | Status: DC

## 2016-07-26 MED ORDER — SENNOSIDES-DOCUSATE SODIUM 8.6-50 MG PO TABS: 1.0000 | ORAL_TABLET | Freq: Every evening | ORAL | Status: DC | PRN

## 2016-07-26 MED ORDER — ACETAMINOPHEN 650 MG RE SUPP: 650.0000 mg | RECTAL | Status: DC | PRN

## 2016-07-26 MED ORDER — SODIUM CHLORIDE 0.9 % IV SOLN
INTRAVENOUS | Status: DC
  Administered 2016-07-26: 03:00:00 via INTRAVENOUS

## 2016-07-26 MED ORDER — ASPIRIN 81 MG PO TBEC
81.0000 mg | DELAYED_RELEASE_TABLET | Freq: Every day | ORAL | Status: DC
Start: 2016-07-27 — End: 2017-05-23

## 2016-07-26 MED ORDER — ACETAMINOPHEN 325 MG PO TABS: 650.0000 mg | ORAL_TABLET | ORAL | Status: DC | PRN

## 2016-07-26 NOTE — Evaluation (Deleted)
Physical Therapy Evaluation Patient Details Name: Virginia Griffith MRN: [id number] DOB: [birth year] Today's Date: 07/26/2016   History of Present Illness  Pt admitted with compalints of L sided weakness/numbness. She is currently being worked up to rule out CVA. PMH is significant for anxiety. Pt denies falls in the last 12 months.   Clinical Impression  PT evaluation completed without deficits identified. Pt is independent for all bed mobility, transfers, and ambulation without assistive device. Able to perform head turns, gait speed changes, and 180 degree turns without instability. [facility] for full community ambulation. Strength is 5/5 bilaterally without focal weakness identified. Negative pronator drift, finger to nose, heel to shin, rapid alternating movements, and pronator drift. EOM intact and facial strength symmetrical. Pt reports resolving numbness/tingling however continues to report decreased sensation to light touch along posterior neck and LLE. Extinction negative. PT order will be completed at this time as no needs identified. Please enter new order if status or needs change.     Follow Up Recommendations      Equipment Recommendations  None recommended by PT    Recommendations for Other Services       Precautions / Restrictions Precautions Precautions: None Restrictions Weight Bearing Restrictions: No      Mobility  Bed Mobility Overal bed mobility: Independent             General bed mobility comments: good speed and sequencing with HOB flat and no bed rails  Transfers Overall transfer level: Independent Equipment used: None             General transfer comment: Good speed and sequencing with good stability in standing  Ambulation/Gait Ambulation/Gait assistance: Independent Ambulation Distance (Feet): 200 Feet Assistive device: None Gait Pattern/deviations: WFL(Within Functional Limits) Gait velocity: WFL Gait velocity interpretation:  >2.62 ft/sec, indicative of independent community ambulator General Gait Details: Good speed and stability noted. Horizontal and vertical head turns without lateral deviation. Pt able to perform gait speed changes and 180 degree turns without instabilty. 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  Stairs            Wheelchair Mobility    Modified Rankin (Stroke Patients Only)       Balance Overall balance assessment: Independent Sitting-balance support: No upper extremity supported Sitting balance-Leahy Scale: Normal     Standing balance support: No upper extremity supported Standing balance-Leahy Scale: Normal Standing balance comment: Single leg stance >10 seconds on each LE                             Pertinent Vitals/Pain Pain Assessment: No/denies pain    Home Living Family/patient expects to be discharged to:: Private residence Living Arrangements: Spouse/significant other Available Help at Discharge: Family Type of Home: House Home Access: Stairs to enter Entrance Stairs-Rails: None Secretary/administratorntrance Stairs-Number of Steps: 1 Home Layout: Multi-level Home Equipment: None      Prior Function Level of Independence: Independent               Hand Dominance   Dominant Hand: Right    Extremity/Trunk Assessment   Upper Extremity Assessment: Overall WFL for tasks assessed           Lower Extremity Assessment: Overall WFL for tasks assessed         Communication   Communication: No difficulties  Cognition Arousal/Alertness: Awake/alert Behavior During Therapy: WFL for tasks assessed/performed Overall Cognitive Status: Within Functional Limits for tasks assessed  General Comments      Exercises     Assessment/Plan    PT Assessment Patent does not need any further PT services  PT Problem List            PT Treatment Interventions      PT Goals (Current goals can be found in the Care Plan section)  Acute Rehab PT  Goals PT Goal Formulation: All assessment and education complete, DC therapy    Frequency     Barriers to discharge        Co-evaluation               End of Session Equipment Utilized During Treatment: Gait belt Activity Tolerance: Patient tolerated treatment well Patient left: in bed;with call bell/phone within reach;with family/visitor present;Other (comment) (Neurologist in room)           Time: 1055-1110 PT Time Calculation (min) (ACUTE ONLY): 15 min   Charges:   PT Evaluation $PT Eval Low Complexity: 1 Procedure     PT G Codes:       Sharalyn Ink Derren Suydam PT, DPT  Virginia Griffith 07/26/2016, 11:27 AM

## 2016-07-26 NOTE — Progress Notes (Signed)
*  PRELIMINARY RESULTS* Echocardiogram 2D Echocardiogram has been performed.  Cristela BlueHege, Bach Rocchi 07/26/2016, 9:25 AM

## 2016-07-26 NOTE — Progress Notes (Signed)
Received MD order to discharge patient to home, reviewed home med prescriptions and follow up information with patient and patient verbalized understanding discharged to home in wheelchair with auxiliary

## 2016-07-26 NOTE — Discharge Instructions (Signed)

## 2016-07-26 NOTE — Plan of Care (Signed)
Problem: Education: Goal: Knowledge of [facility] Education information/materials will improve Outcome: Progressing Oriented pt to unit.  VS WDL, denies pain, nausea.  Reports only sx L sided numbness/decreased sensation.  NIHSS/Q2H neuro checks consistent w/ pt's complaints.  Swallow screen passed, pt has diet order.  IVF infusing, no issues w/ PIV.  Call bell within reach, WCTM.

## 2016-07-26 NOTE — Consult Note (Addendum)
Referring Physician: Allena Katz    Chief Complaint: Left hemiparesis  HPI: Virginia Griffith is an 45 y.o. female with no significant PMH who reports that she awakened at baseline yesterday.  Was at work when she had the acute onset of numbness on the left side of her face.  The numbness throughout the day spread to involve her entire left side and involved some weakness as well.  She described the numbness as if a "line was drawn down the middle of her body".  She noted work finding difficulty and unsteady gait and soon developed a panic attack.  Although her symptoms have improved she continues to have some left sided numbness.  Initial NIHSS of 1.    Date last known well: Date: 07/25/2016 Time last known well: Time: 11:00 tPA Given: No: Resolving symptoms  Past Medical History:  Diagnosis Date  . Anxiety     Past Surgical History:  Procedure Laterality Date  . CESAREAN SECTION      Family history: Father with COPD.  Mother with MS.    Social History:  reports that she has never smoked. She has never used smokeless tobacco. She reports that she drinks alcohol. She reports that she does not use drugs.  Allergies:  Allergies  Allergen Reactions  . Latex Rash    Medications:  I have reviewed the patient's current medications. Prior to Admission:  Prescriptions Prior to Admission  Medication Sig Dispense Refill Last Dose  . cyclobenzaprine (FLEXERIL) 10 MG tablet Take 1 tablet (10 mg total) by mouth 3 (three) times daily as needed for muscle spasms. (Patient not taking: Reported on 07/28/2015) 15 tablet 0 Not Taking at Unknown time  . HYDROcodone-acetaminophen (NORCO/VICODIN) 5-325 MG per tablet Take 1 tablet by mouth every 6 (six) hours as needed for moderate pain. (Patient not taking: Reported on 07/28/2015) 15 tablet 0 Not Taking at Unknown time  . ibuprofen (ADVIL,MOTRIN) 800 MG tablet Take 1 tablet (800 mg total) by mouth every 8 (eight) hours as needed. (Patient not taking: Reported  on 07/28/2015) 21 tablet 0 Not Taking at Unknown time  . metroNIDAZOLE (METROGEL VAGINAL) 0.75 % vaginal gel Place 1 Applicatorful vaginally daily. (Patient not taking: Reported on 09/20/2014) 70 g 1 Not Taking at Unknown time  . Naproxen Sodium (ALEVE PO) Take 1-2 tablets by mouth every 12 (twelve) hours as needed (pain).   07/27/2015 at Unknown time   Scheduled: . aspirin EC  81 mg Oral Daily  . enoxaparin (LOVENOX) injection  40 mg Subcutaneous Q0600    ROS: History obtained from the patient  General ROS: negative for - chills, fatigue, fever, night sweats, weight gain or weight loss Psychological ROS: negative for - behavioral disorder, hallucinations, memory difficulties, mood swings or suicidal ideation Ophthalmic ROS: negative for - blurry vision, double vision, eye pain or loss of vision ENT ROS: negative for - epistaxis, nasal discharge, oral lesions, sore throat, tinnitus or vertigo Allergy and Immunology ROS: negative for - hives or itchy/watery eyes Hematological and Lymphatic ROS: negative for - bleeding problems, bruising or swollen lymph nodes Endocrine ROS: negative for - galactorrhea, hair pattern changes, polydipsia/polyuria or temperature intolerance Respiratory ROS: negative for - cough, hemoptysis, shortness of breath or wheezing Cardiovascular ROS: negative for - chest pain, dyspnea on exertion, edema or irregular heartbeat Gastrointestinal ROS: negative for - abdominal pain, diarrhea, hematemesis, nausea/vomiting or stool incontinence Genito-Urinary ROS: negative for - dysuria, hematuria, incontinence or urinary frequency/urgency Musculoskeletal ROS: negative for - joint swelling or muscular  weakness Neurological ROS: as noted in HPI Dermatological ROS: negative for rash and skin lesion changes  Physical Examination: Blood pressure 132/78, pulse 76, temperature 98.6 F (37 C), resp. rate 18, height 5\' 11"  (1.803 m), weight 82.4 kg (181 lb 11.2 oz), last menstrual  period 06/29/2016, SpO2 100 %.  HEENT-  Normocephalic, no lesions, without obvious abnormality.  Normal external eye and conjunctiva.  Normal TM's bilaterally.  Normal auditory canals and external ears. Normal external nose, mucus membranes and septum.  Normal pharynx. Cardiovascular- S1, S2 normal, pulses palpable throughout   Lungs- chest clear, no wheezing, rales, normal symmetric air entry Abdomen- soft, non-tender; bowel sounds normal; no masses,  no organomegaly Extremities- no edema Lymph-no adenopathy palpable Musculoskeletal-no joint tenderness, deformity or swelling Skin-warm and dry, no hyperpigmentation, vitiligo, or suspicious lesions  Neurological Examination Mental Status: Alert, oriented, thought content appropriate.  Speech fluent without evidence of aphasia.  Able to follow 3 step commands without difficulty. Cranial Nerves: II: Discs flat bilaterally; Visual fields grossly normal, pupils equal, round, reactive to light and accommodation III,IV, VI: ptosis not present, extra-ocular motions intact bilaterally V,VII: smile symmetric, facial light touch sensation normal bilaterally VIII: hearing normal bilaterally IX,X: gag reflex present XI: bilateral shoulder shrug XII: midline tongue extension Motor: Right : Upper extremity   5/5    Left:     Upper extremity   5/5  Lower extremity   5/5     Lower extremity   5/5 Tone and bulk:normal tone throughout; no atrophy noted Sensory: Pinprick and light touch decreased on the left spitting the midline Deep Tendon Reflexes: 2+ and symmetric throughout Plantars: Right: downgoing   Left: downgoing Cerebellar: normal finger-to-nose and normal heel-to-shin testing bilaterally Gait: normal gait and station    Laboratory Studies:  Basic Metabolic Panel:  Recent Labs Lab 07/25/16 2030  NA 137  K 4.5  CL 104  CO2 26  GLUCOSE 106*  BUN 15  CREATININE 1.00  CALCIUM 9.8    Liver Function Tests: No results for input(s):  AST, ALT, ALKPHOS, BILITOT, PROT, ALBUMIN in the last 168 hours. No results for input(s): LIPASE, AMYLASE in the last 168 hours. No results for input(s): AMMONIA in the last 168 hours.  CBC:  Recent Labs Lab 07/25/16 2030  WBC 6.0  HGB 14.4  HCT 42.9  MCV 85.2  PLT 191    Cardiac Enzymes:  Recent Labs Lab 07/26/16 0321 07/26/16 1018  TROPONINI <0.03 <0.03    BNP: Invalid input(s): POCBNP  CBG: No results for input(s): GLUCAP in the last 168 hours.  Microbiology: No results found for this or any previous visit.  Coagulation Studies: No results for input(s): LABPROT, INR in the last 72 hours.  Urinalysis:  Recent Labs Lab 07/25/16 2030  COLORURINE YELLOW*  LABSPEC 1.019  PHURINE 5.0  GLUCOSEU NEGATIVE  HGBUR 1+*  BILIRUBINUR NEGATIVE  KETONESUR NEGATIVE  PROTEINUR NEGATIVE  NITRITE NEGATIVE  LEUKOCYTESUR NEGATIVE    Lipid Panel:    Component Value Date/Time   CHOL 210 (H) 07/26/2016 0321   TRIG 61 07/26/2016 0321   HDL 75 07/26/2016 0321   CHOLHDL 2.8 07/26/2016 0321   VLDL 12 07/26/2016 0321   LDLCALC 123 (H) 07/26/2016 0321    HgbA1C: No results found for: HGBA1C  Urine Drug Screen:     Component Value Date/Time   LABOPIA NONE DETECTED 07/25/2016 2030   COCAINSCRNUR NONE DETECTED 07/25/2016 2030   LABBENZ NONE DETECTED 07/25/2016 2030   AMPHETMU NONE DETECTED 07/25/2016  2030   THCU NONE DETECTED 07/25/2016 2030   LABBARB NONE DETECTED 07/25/2016 2030    Alcohol Level: No results for input(s): ETH in the last 168 hours.   Imaging: Ct Head Wo Contrast  Result Date: 07/26/2016 CLINICAL DATA:  Acute onset of generalized weakness. Left eye twitching. Initial encounter. EXAM: CT HEAD WITHOUT CONTRAST TECHNIQUE: Contiguous axial images were obtained from the base of the skull through the vertex without intravenous contrast. COMPARISON:  None. FINDINGS: Brain: No evidence of acute infarction, hemorrhage, hydrocephalus, extra-axial collection  or mass lesion/mass effect. The posterior fossa, including the cerebellum, brainstem and fourth ventricle, is within normal limits. The third and lateral ventricles, and basal ganglia are unremarkable in appearance. The cerebral hemispheres are symmetric in appearance, with normal gray-white differentiation. No mass effect or midline shift is seen. Vascular: No hyperdense vessel or unexpected calcification. Skull: There is no evidence of fracture; visualized osseous structures are unremarkable in appearance. Sinuses/Orbits: The visualized portions of the orbits are within normal limits. The paranasal sinuses and mastoid air cells are well-aerated. Other: No significant soft tissue abnormalities are seen. IMPRESSION: Unremarkable noncontrast CT of the head. Electronically Signed   By: Roanna RaiderJeffery  Chang M.D.   On: 07/26/2016 00:16   Mr Brain Wo Contrast  Result Date: 07/26/2016 CLINICAL DATA:  TIA with numbness, tingling, and weakness on the left side. EXAM: MRI HEAD WITHOUT CONTRAST MRA HEAD WITHOUT CONTRAST TECHNIQUE: Multiplanar, multiecho pulse sequences of the brain and surrounding structures were obtained without intravenous contrast. Angiographic images of the head were obtained using MRA technique without contrast. COMPARISON:  Head CT from yesterday FINDINGS: MRI HEAD FINDINGS Brain: Normal. No acute or remote infarction, hemorrhage, hydrocephalus, extra-axial collection or mass lesion. Vascular: Normal flow voids in the dural venous sinuses. Arterial findings below. Skull and upper cervical spine: Negative Sinuses/Orbits: Negative MRA HEAD FINDINGS Symmetric carotid and vertebral arteries. Intact circle-of-Willis. Standard vertebrobasilar branching. No major branch occlusion, flow limiting stenosis, or aneurysm. No detected beading or vascular malformation. IMPRESSION: Negative brain MRI and MRA.  No explanation for symptoms. Electronically Signed   By: Marnee SpringJonathon  Watts M.D.   On: 07/26/2016 10:27   [place]  Carotid Bilateral (at [facility] And [facility] Only)  Result Date: 07/26/2016 CLINICAL DATA:  TIA EXAM: BILATERAL CAROTID DUPLEX ULTRASOUND TECHNIQUE: Wallace CullensGray scale imaging, color Doppler and duplex ultrasound were performed of bilateral carotid and vertebral arteries in the neck. COMPARISON:  None. FINDINGS: Criteria: Quantification of carotid stenosis is based on velocity parameters that correlate the residual internal carotid diameter with NASCET-based stenosis levels, using the diameter of the distal internal carotid lumen as the denominator for stenosis measurement. The following velocity measurements were obtained: RIGHT ICA:  62 cm/sec CCA:  88 cm/sec SYSTOLIC ICA/CCA RATIO:  0.7 DIASTOLIC ICA/CCA RATIO:  1.5 ECA:  79 cm/sec LEFT ICA:  88 cm/sec CCA:  87 cm/sec SYSTOLIC ICA/CCA RATIO:  1.0 DIASTOLIC ICA/CCA RATIO:  1.5 ECA:  72 cm/sec RIGHT CAROTID ARTERY: Little if any plaque in the bulb. Low resistance internal carotid Doppler pattern. RIGHT VERTEBRAL ARTERY:  Antegrade. LEFT CAROTID ARTERY: Little if any plaque in the bulb. Low resistance internal carotid Doppler pattern. LEFT VERTEBRAL ARTERY:  Antegrade. IMPRESSION: Less than 50% stenosis in the right and left internal carotid arteries. Electronically Signed   By: Jolaine ClickArthur  Hoss M.D.   On: 07/26/2016 08:57   Mr Maxine GlennMra Head/brain ZOWo Cm  Result Date: 07/26/2016 CLINICAL DATA:  TIA with numbness, tingling, and weakness on the left side. EXAM: MRI HEAD  WITHOUT CONTRAST MRA HEAD WITHOUT CONTRAST TECHNIQUE: Multiplanar, multiecho pulse sequences of the brain and surrounding structures were obtained without intravenous contrast. Angiographic images of the head were obtained using MRA technique without contrast. COMPARISON:  Head CT from yesterday FINDINGS: MRI HEAD FINDINGS Brain: Normal. No acute or remote infarction, hemorrhage, hydrocephalus, extra-axial collection or mass lesion. Vascular: Normal flow voids in the dural venous sinuses. Arterial findings below. Skull and  upper cervical spine: Negative Sinuses/Orbits: Negative MRA HEAD FINDINGS Symmetric carotid and vertebral arteries. Intact circle-of-Willis. Standard vertebrobasilar branching. No major branch occlusion, flow limiting stenosis, or aneurysm. No detected beading or vascular malformation. IMPRESSION: Negative brain MRI and MRA.  No explanation for symptoms. Electronically Signed   By: Marnee Spring M.D.   On: 07/26/2016 10:27    Assessment: 45 y.o. female presenting with a left hemiparesis.  Symptoms have not completely resolved.  MRI of the brain personally reviewed and is unremarkable showing no evidence of infarct or MS.  Carotid dopplers show no evidence of hemodynamically significant stenosis.  Echocardiogram results pending.  A1c pending, LDL 123.  Patient without stroke risk factors other than hyperlipidemia.  Can not rule out the influence of anxiety.  Stroke Risk Factors - hyperlipidemia  Plan: 1. Prophylactic therapy-Antiplatelet med: Aspirin - dose 81mg  daily 2. EEG as an outpatient 3. Cholesterol management 4. Would follow up with Gyn concerning contraception choices   Thana Farr, MD Neurology [phone] 07/26/2016, 11:54 AM

## 2016-07-26 NOTE — Evaluation (Signed)
Physical Therapy Evaluation Patient Details Name: Virginia Griffith MRN: [id number] DOB: Jun 30, 1971 Today's Date: 07/26/2016   History of Present Illness  Pt admitted with compalints of L sided weakness/numbness. She is currently being worked up to rule out CVA. PMH is significant for anxiety. Pt denies falls in the last 12 months.   Clinical Impression  PT evaluation completed without any deficits in strength or mobility identified. Face symmetrical, EOM intact, pronator drift negative, finger to nose negative, heel to shin negative and 5/5 strength bilaterally. Pt reports some diminished sensation posterior L neck but denies deficits in sensation to light touch in LUE. Pt reports decreased light touch sensation to entire LLE. No extinction. Pt is independent with all bed mobility, transfers, and ambulation. No instability noted with gait. No PT needs identified at this time. Will complete order. Please enter new order if status or needs change.     Follow Up Recommendations      Equipment Recommendations  None recommended by PT    Recommendations for Other Services       Precautions / Restrictions Precautions Precautions: None Restrictions Weight Bearing Restrictions: No      Mobility  Bed Mobility Overal bed mobility: Independent             General bed mobility comments: good speed and sequencing with HOB flat and no bed rails  Transfers Overall transfer level: Independent Equipment used: None             General transfer comment: Good speed and sequencing with good stability in standing  Ambulation/Gait Ambulation/Gait assistance: Independent Ambulation Distance (Feet): 200 Feet Assistive device: None Gait Pattern/deviations: WFL(Within Functional Limits) Gait velocity: WFL Gait velocity interpretation: >2.62 ft/sec, indicative of independent community ambulator General Gait Details: Good speed and stability noted. Horizontal and vertical head turns without  lateral deviation. Pt able to perform gait speed changes and 180 degree turns without instabilty. 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  Stairs            Wheelchair Mobility    Modified Rankin (Stroke Patients Only)       Balance Overall balance assessment: Independent Sitting-balance support: No upper extremity supported Sitting balance-Leahy Scale: Normal     Standing balance support: No upper extremity supported Standing balance-Leahy Scale: Normal Standing balance comment: Single leg stance >10 seconds on each LE                             Pertinent Vitals/Pain Pain Assessment: No/denies pain    Home Living Family/patient expects to be discharged to:: Private residence Living Arrangements: Spouse/significant other Available Help at Discharge: Family Type of Home: House Home Access: Stairs to enter Entrance Stairs-Rails: None Secretary/administratorntrance Stairs-Number of Steps: 1 Home Layout: Multi-level Home Equipment: None      Prior Function Level of Independence: Independent               Hand Dominance   Dominant Hand: Right    Extremity/Trunk Assessment   Upper Extremity Assessment: Overall WFL for tasks assessed           Lower Extremity Assessment: Overall WFL for tasks assessed         Communication   Communication: No difficulties  Cognition Arousal/Alertness: Awake/alert Behavior During Therapy: WFL for tasks assessed/performed Overall Cognitive Status: Within Functional Limits for tasks assessed  General Comments      Exercises     Assessment/Plan    PT Assessment Patent does not need any further PT services  PT Problem List            PT Treatment Interventions      PT Goals (Current goals can be found in the Care Plan section)  Acute Rehab PT Goals PT Goal Formulation: All assessment and education complete, DC therapy    Frequency     Barriers to discharge        Co-evaluation                End of Session Equipment Utilized During Treatment: Gait belt Activity Tolerance: Patient tolerated treatment well Patient left: in bed;with call bell/phone within reach;with family/visitor present;Other (comment) (Neurologist in room)           Time: 1055-1110 PT Time Calculation (min) (ACUTE ONLY): 15 min   Charges:   PT Evaluation $PT Eval Low Complexity: 1 Procedure     PT G Codes:       Sharalyn Ink Alcario Tinkey PT, DPT   Amaru Burroughs 07/26/2016, 11:24 AM

## 2016-07-26 NOTE — Discharge Summary (Signed)
Virginia Griffith, 45 y.o., DOB [birth year], MRN [id number]. Admission date: 07/25/2016 Discharge Date 07/26/2016 Primary MD Geraldo Pitter, MD Admitting Physician Tonye Royalty, DO  Admission Diagnosis  Cerebrovascular accident (CVA), unspecified mechanism [facility]) [I63.9]  Discharge Diagnosis   Active Problems:   TIA (transient ischemic attack)    Anxiety disorder        Hospital Course  Virginia Griffith is a 45 y.o. female with a known history of Anxiety presents to the emergency department complaining of numbness, tingling and weakness of the left side.  Patient was in a usual state of health until this morning when she noticed left facial upper and lower extremity numbness which progressed throughout the course the day to include weakness, difficulty with word finding and unsteady gait. She reports that shortly following the onset of symptoms she developed panic attack symptoms including severe anxiety hyperventilation and mild nausea. Of note she also reported increase in stress and anxiety related to her marriage separation. Due to these symptoms she came to the ED. And had a CT scan of the head which was negative for. Further workup included MRI of the brain which showed no evidence of stroke. Carotid Doppler showed plaque. She was seen by neurology and was cleared to be discharged. Her symptoms may have been related to anxiety attack.           Consults  neurology  Significant Tests:  See full reports for all details     Ct Head Wo Contrast  Result Date: 07/26/2016 CLINICAL DATA:  Acute onset of generalized weakness. Left eye twitching. Initial encounter. EXAM: CT HEAD WITHOUT CONTRAST TECHNIQUE: Contiguous axial images were obtained from the base of the skull through the vertex without intravenous contrast. COMPARISON:  None. FINDINGS: Brain: No evidence of acute infarction, hemorrhage, hydrocephalus, extra-axial collection or mass lesion/mass effect. The posterior fossa,  including the cerebellum, brainstem and fourth ventricle, is within normal limits. The third and lateral ventricles, and basal ganglia are unremarkable in appearance. The cerebral hemispheres are symmetric in appearance, with normal gray-white differentiation. No mass effect or midline shift is seen. Vascular: No hyperdense vessel or unexpected calcification. Skull: There is no evidence of fracture; visualized osseous structures are unremarkable in appearance. Sinuses/Orbits: The visualized portions of the orbits are within normal limits. The paranasal sinuses and mastoid air cells are well-aerated. Other: No significant soft tissue abnormalities are seen. IMPRESSION: Unremarkable noncontrast CT of the head. Electronically Signed   By: Roanna Raider M.D.   On: 07/26/2016 00:16   Mr Brain Wo Contrast  Result Date: 07/26/2016 CLINICAL DATA:  TIA with numbness, tingling, and weakness on the left side. EXAM: MRI HEAD WITHOUT CONTRAST MRA HEAD WITHOUT CONTRAST TECHNIQUE: Multiplanar, multiecho pulse sequences of the brain and surrounding structures were obtained without intravenous contrast. Angiographic images of the head were obtained using MRA technique without contrast. COMPARISON:  Head CT from yesterday FINDINGS: MRI HEAD FINDINGS Brain: Normal. No acute or remote infarction, hemorrhage, hydrocephalus, extra-axial collection or mass lesion. Vascular: Normal flow voids in the dural venous sinuses. Arterial findings below. Skull and upper cervical spine: Negative Sinuses/Orbits: Negative MRA HEAD FINDINGS Symmetric carotid and vertebral arteries. Intact circle-of-Willis. Standard vertebrobasilar branching. No major branch occlusion, flow limiting stenosis, or aneurysm. No detected beading or vascular malformation. IMPRESSION: Negative brain MRI and MRA.  No explanation for symptoms. Electronically Signed   By: Marnee Spring M.D.   On: 07/26/2016 10:27   US Carotid Bilateral (at [facility] Only)  Result  Date: 07/26/2016 CLINICAL DATA:  TIA EXAM: BILATERAL CAROTID DUPLEX ULTRASOUND TECHNIQUE: Wallace CullensGray scale imaging, color Doppler and duplex ultrasound were performed of bilateral carotid and vertebral arteries in the neck. COMPARISON:  None. FINDINGS: Criteria: Quantification of carotid stenosis is based on velocity parameters that correlate the residual internal carotid diameter with NASCET-based stenosis levels, using the diameter of the distal internal carotid lumen as the denominator for stenosis measurement. The following velocity measurements were obtained: RIGHT ICA:  62 cm/sec CCA:  88 cm/sec SYSTOLIC ICA/CCA RATIO:  0.7 DIASTOLIC ICA/CCA RATIO:  1.5 ECA:  79 cm/sec LEFT ICA:  88 cm/sec CCA:  87 cm/sec SYSTOLIC ICA/CCA RATIO:  1.0 DIASTOLIC ICA/CCA RATIO:  1.5 ECA:  72 cm/sec RIGHT CAROTID ARTERY: Little if any plaque in the bulb. Low resistance internal carotid Doppler pattern. RIGHT VERTEBRAL ARTERY:  Antegrade. LEFT CAROTID ARTERY: Little if any plaque in the bulb. Low resistance internal carotid Doppler pattern. LEFT VERTEBRAL ARTERY:  Antegrade. IMPRESSION: Less than 50% stenosis in the right and left internal carotid arteries. Electronically Signed   By: Jolaine ClickArthur  Hoss M.D.   On: 07/26/2016 08:57   Mr Maxine GlennMra Head/brain BJWo Cm  Result Date: 07/26/2016 CLINICAL DATA:  TIA with numbness, tingling, and weakness on the left side. EXAM: MRI HEAD WITHOUT CONTRAST MRA HEAD WITHOUT CONTRAST TECHNIQUE: Multiplanar, multiecho pulse sequences of the brain and surrounding structures were obtained without intravenous contrast. Angiographic images of the head were obtained using MRA technique without contrast. COMPARISON:  Head CT from yesterday FINDINGS: MRI HEAD FINDINGS Brain: Normal. No acute or remote infarction, hemorrhage, hydrocephalus, extra-axial collection or mass lesion. Vascular: Normal flow voids in the dural venous sinuses. Arterial findings below. Skull and upper cervical spine: Negative Sinuses/Orbits:  Negative MRA HEAD FINDINGS Symmetric carotid and vertebral arteries. Intact circle-of-Willis. Standard vertebrobasilar branching. No major branch occlusion, flow limiting stenosis, or aneurysm. No detected beading or vascular malformation. IMPRESSION: Negative brain MRI and MRA.  No explanation for symptoms. Electronically Signed   By: Marnee SpringJonathon  Watts M.D.   On: 07/26/2016 10:27       Today   Subjective:   Virginia GearingKeiche Griffith  patient feels better denies any numbness or tingling  Objective:   Blood pressure 132/78, pulse 76, temperature 98.6 F (37 C), resp. rate 18, height 5\' 11"  (1.803 m), weight 181 lb 11.2 oz (82.4 kg), last menstrual period 06/29/2016, SpO2 100 %.  . No intake or output data in the 24 hours ending 07/26/16 1158  Exam VITAL SIGNS: Blood pressure 132/78, pulse 76, temperature 98.6 F (37 C), resp. rate 18, height 5\' 11"  (1.803 m), weight 181 lb 11.2 oz (82.4 kg), last menstrual period 06/29/2016, SpO2 100 %.  GENERAL:  45 y.o.-year-old patient lying in the bed with no acute distress.  EYES: Pupils equal, round, reactive to light and accommodation. No scleral icterus. Extraocular muscles intact.  HEENT: Head atraumatic, normocephalic. Oropharynx and nasopharynx clear.  NECK:  Supple, no jugular venous distention. No thyroid enlargement, no tenderness.  LUNGS: Normal breath sounds bilaterally, no wheezing, rales,rhonchi or crepitation. No use of accessory muscles of respiration.  CARDIOVASCULAR: S1, S2 normal. No murmurs, rubs, or gallops.  ABDOMEN: Soft, nontender, nondistended. Bowel sounds present. No organomegaly or mass.  EXTREMITIES: No pedal edema, cyanosis, or clubbing.  NEUROLOGIC: Cranial nerves II through XII are intact. Muscle strength 5/5 in all extremities. Sensation intact. Gait not checked.  PSYCHIATRIC: The patient is alert and oriented x 3.  SKIN: No obvious rash, lesion, or ulcer.  Data Review     CBC w Diff:  Lab Results  Component Value Date    WBC 6.0 07/25/2016   HGB 14.4 07/25/2016   HCT 42.9 07/25/2016   PLT 191 07/25/2016   CMP:  Lab Results  Component Value Date   NA 137 07/25/2016   K 4.5 07/25/2016   CL 104 07/25/2016   CO2 26 07/25/2016   BUN 15 07/25/2016   CREATININE 1.00 07/25/2016  .  Micro Results No results found for this or any previous visit (from the past 240 hour(s)).      Code Status Orders        Start     Ordered   07/26/16 0309  Full code  Continuous     07/26/16 0308    Code Status History    Date Active Date Inactive Code Status Order ID Comments User Context   This patient has a current code status but no historical code status.          Follow-up Information    Geraldo Pitter, MD. Go in 7 days.   Specialty:  Family Medicine Contact information: [address] [place] [address] [phone]           Discharge Medications     Medication List    TAKE these medications   ALEVE PO Take 1-2 tablets by mouth every 12 (twelve) hours as needed (pain).   aspirin 81 MG EC tablet Take 1 tablet (81 mg total) by mouth daily. Start taking on:  07/27/2016   cyclobenzaprine 10 MG tablet Commonly known as:  FLEXERIL Take 1 tablet (10 mg total) by mouth 3 (three) times daily as needed for muscle spasms.   HYDROcodone-acetaminophen 5-325 MG tablet Commonly known as:  NORCO/VICODIN Take 1 tablet by mouth every 6 (six) hours as needed for moderate pain.   ibuprofen 800 MG tablet Commonly known as:  ADVIL,MOTRIN Take 1 tablet (800 mg total) by mouth every 8 (eight) hours as needed.   metroNIDAZOLE 0.75 % vaginal gel Commonly known as:  METROGEL VAGINAL Place 1 Applicatorful vaginally daily.          Total Time in preparing paper work, data evaluation and todays exam - 35 minutes  Auburn Bilberry M.D on 07/26/2016 at 11:58 AM  [facility]   Office  [phone]

## 2016-07-26 NOTE — Evaluation (Signed)
Occupational Therapy Evaluation Patient Details Name: Virginia Griffith MRN: [id number] DOB: 01/24/1971 Today's Date: 07/26/2016    History of Present Illness Pt. is a 45 y.o. female who was admitted with Left sided weakness/numbness.   Clinical Impression   Pt. Is a 45 y.o. Female who was admitted with left sided weakness/numbness. Pt. Presents with Left UE numbness, however is improving. Pt. Is able to use her left hand to engage in daily ADL tasks, and self-care needs. Pt. Education was provided about LUE, and hand exercises, and activities to improve sensation/awareness of the Left hand during ADL tasks. No further OT services are indicated at this time.    Follow Up Recommendations  No OT follow up    Equipment Recommendations       Recommendations for Other Services       Precautions / Restrictions Precautions Precautions: None Restrictions Weight Bearing Restrictions: No         Balance Overall balance assessment: Independent Sitting-balance support: No upper extremity supported Sitting balance-Leahy Scale: Normal                                 ADL    WFL                                           Vision     Perception     Praxis      Pertinent Vitals/Pain Pain Assessment: No/denies pain     Hand Dominance Right   Extremity/Trunk Assessment Upper Extremity Assessment Upper Extremity Assessment: Overall WFL for tasks assessed (Grip strength: Right: 110#, Left: 74#. Intact light touch, and proprioceptive awareness.)       Communication Communication Communication: No difficulties   Cognition Arousal/Alertness: Awake/alert Behavior During Therapy: WFL for tasks assessed/performed Overall Cognitive Status: Within Functional Limits for tasks assessed                     General Comments       Exercises       Shoulder Instructions      Home Living Family/patient expects to be discharged to::  Private residence Living Arrangements: Spouse/significant other Available Help at Discharge: Family Type of Home: House Home Access: Stairs to enter Secretary/administratorntrance Stairs-Number of Steps: 1 Entrance Stairs-Rails: None Home Layout: Multi-level Alternate Level Stairs-Number of Steps: 23 Alternate Level Stairs-Rails: Right;Left Bathroom Shower/Tub: Tub/shower unit         Home Equipment: None          Prior Functioning/Environment Level of Independence: Independent                 OT Problem List: Impaired sensation   OT Treatment/Interventions:      OT Goals(Current goals can be found in the care plan section)    OT Frequency:     Barriers to D/C:            Co-evaluation              End of Session    Activity Tolerance: Patient tolerated treatment well Patient left: in bed;with call bell/phone within reach   Time: 1115-1135 OT Time Calculation (min): 20 min Charges:  OT General Charges $OT Visit: 1 Procedure OT Evaluation $OT Eval Moderate Complexity: 1 Procedure G-Codes: OT G-codes **NOT FOR INPATIENT CLASS** Functional  Limitation: Carrying, moving and handling objects Carrying, Moving and Handling Objects Current Status [phone]): At least 1 percent but less than 20 percent impaired, limited or restricted Carrying, Moving and Handling Objects Goal Status [phone]): At least 1 percent but less than 20 percent impaired, limited or restricted Carrying, Moving and Handling Objects Discharge Status [phone]): At least 1 percent but less than 20 percent impaired, limited or restricted Self Care Current Status (B1478): At least 1 percent but less than 20 percent impaired, limited or restricted Self Care Goal Status (G9562): At least 1 percent but less than 20 percent impaired, limited or restricted Self Care Discharge Status [phone]): At least 1 percent but less than 20 percent impaired, limited or restricted  Olegario Messier, MS, OTR/L 07/26/2016, 11:55  AM

## 2016-07-26 NOTE — H&P (Signed)
[facility] Ad[facility] History and Physical [employer] Adael Culbreath, D.O.  ---------------------------------------------------------------------------------------------------------------------   PATIENT NAME: Virginia Griffith MR#: [id number] DATE OF BIRTH: 1971-08-24 DATE OF ADMISSION: 07/25/2016 PRIMARY CARE PHYSICIAN: Virginia PitterBLAND,VEITA J, MD  REQUESTING/REFERRING PHYSICIAN: ED Dr. Don PerkingVeronese  CHIEF COMPLAINT: Chief Complaint  Patient presents with  . Weakness    HISTORY OF PRESENT ILLNESS: Virginia GearingKeiche Dunne is a 45 y.o. female with a known history of Anxiety presents to the emergency department complaining of numbness, tingling and weakness of the left side.  Patient was in a usual state of health until this morning when she noticed left facial upper and lower extremity numbness which progressed throughout the course the day to include weakness, difficulty with word finding and unsteady gait. She reports that shortly following the onset of symptoms she developed panic attack symptoms including severe anxiety hyperventilation and mild nausea. At present she reports that her symptoms persist with some mild numbness and tingling of the face left arm and left leg but do not include any motor weakness at this time..  Of note she reports increased stress and anxiety as it relates to a marriage separation.  Otherwise there has been no change in status. Patient has been taking medication as prescribed and there has been no recent change in medication or diet.  There has been no recent illness, travel or sick contacts.    Patient denies fevers/chills, weakness, dizziness, chest pain, shortness of breath, N/V/C/D, abdominal pain, dysuria/frequency, changes in mental status.   EMS/ED COURSE:   Patient received aspirin 325.  PAST MEDICAL HISTORY: Past Medical History:  Diagnosis Date  . Anxiety       PAST SURGICAL HISTORY: Past Surgical History:  Procedure Laterality Date  .  CESAREAN SECTION        SOCIAL HISTORY: Social History  Substance Use Topics  . Smoking status: Never Smoker  . Smokeless tobacco: Never Used  . Alcohol use Yes     Comment: wine socially       FAMILY HISTORY: Mother with MS and father with CHF   MEDICATIONS AT HOME: Prior to Admission medications   Medication Sig Start Date End Date Taking? Authorizing Provider  cyclobenzaprine (FLEXERIL) 10 MG tablet Take 1 tablet (10 mg total) by mouth 3 (three) times daily as needed for muscle spasms. Patient not taking: Reported on 07/28/2015 12/15/14   Charlestine Nighthristopher Lawyer, PA-C  HYDROcodone-acetaminophen (NORCO/VICODIN) 5-325 MG per tablet Take 1 tablet by mouth every 6 (six) hours as needed for moderate pain. Patient not taking: Reported on 07/28/2015 12/15/14   Charlestine Nighthristopher Lawyer, PA-C  ibuprofen (ADVIL,MOTRIN) 800 MG tablet Take 1 tablet (800 mg total) by mouth every 8 (eight) hours as needed. Patient not taking: Reported on 07/28/2015 12/15/14   Charlestine Nighthristopher Lawyer, PA-C  metroNIDAZOLE (METROGEL VAGINAL) 0.75 % vaginal gel Place 1 Applicatorful vaginally daily. Patient not taking: Reported on 09/20/2014 08/07/12   Kirkland HunArthur Stringer, MD  Naproxen Sodium (ALEVE PO) Take 1-2 tablets by mouth every 12 (twelve) hours as needed (pain).    Historical Provider, MD      DRUG ALLERGIES: Allergies  Allergen Reactions  . Latex Rash     REVIEW OF SYSTEMS: CONSTITUTIONAL: No fatigue, weakness, fever, chills, weight gain/loss, headache EYES: No blurry or double vision. ENT: No tinnitus, postnasal drip, redness or soreness of the oropharynx. RESPIRATORY: No dyspnea, cough, wheeze, hemoptysis. CARDIOVASCULAR: No chest pain, orthopnea, palpitations, syncope. GASTROINTESTINAL: No nausea, vomiting, constipation, diarrhea, abdominal pain. No hematemesis, melena or hematochezia. GENITOURINARY: No  dysuria, frequency, hematuria. ENDOCRINE: No polyuria or nocturia. No heat or cold intolerance. HEMATOLOGY: No  anemia, bruising, bleeding. INTEGUMENTARY: No rashes, ulcers, lesions. MUSCULOSKELETAL: No pain, arthritis, swelling, gout. NEUROLOGIC: Positive numbness, tingling, weakness or ataxia. No seizure-type activity. PSYCHIATRIC: Positive anxiety, depression, insomnia.  PHYSICAL EXAMINATION: VITAL SIGNS: Blood pressure 130/87, pulse (!) 56, temperature 98.6 F (37 C), temperature source Oral, resp. rate 18, height 5\' 11"  (1.803 m), weight 77.1 kg (170 lb), last menstrual period 06/29/2016, SpO2 100 %.  GENERAL: 45 y.o.-year-old black female patient, well-developed, well-nourished lying in the bed in no acute distress.  Pleasant and cooperative.   HEENT: Head atraumatic, normocephalic. Pupils equal, round, reactive to light and accommodation. No scleral icterus. Extraocular muscles intact. Oropharynx is clear. Mucus membranes moist. NECK: Supple, full range of motion. No JVD, no bruit heard. No cervical lymphadenopathy. CHEST: Normal breath sounds bilaterally. No wheezing, rales, rhonchi or crackles. No use of accessory muscles of respiration.  No reproducible chest wall tenderness.  CARDIOVASCULAR: S1, S2 normal. No murmurs, rubs, or gallops appreciated. Cap refill <2 seconds. ABDOMEN: Soft, nontender, nondistended. No rebound, guarding, rigidity. Normoactive bowel sounds present in all four quadrants. No organomegaly or mass. EXTREMITIES: Full range of motion. No pedal edema, cyanosis, or clubbing. NEUROLOGIC: Cranial nerves II through XII are grossly intact with no focal motor deficit. Muscle strength 5/5 in all extremities. Sensation is slightly diminished in the left face left upper and lower extremities Gait not checked. PSYCHIATRIC: The patient is alert and oriented x 3. Normal affect, mood, thought content. SKIN: Warm, dry, and intact without obvious rash, lesion, or ulcer.  LABORATORY PANEL:  CBC  Recent Labs Lab 07/25/16 2030  WBC 6.0  HGB 14.4  HCT 42.9  PLT 191    ----------------------------------------------------------------------------------------------------------------- Chemistries  Recent Labs Lab 07/25/16 2030  NA 137  K 4.5  CL 104  CO2 26  GLUCOSE 106*  BUN 15  CREATININE 1.00  CALCIUM 9.8   ------------------------------------------------------------------------------------------------------------------ Cardiac Enzymes No results for input(s): TROPONINI in the last 168 hours. ------------------------------------------------------------------------------------------------------------------  RADIOLOGY: Ct Head Wo Contrast  Result Date: 07/26/2016 CLINICAL DATA:  Acute onset of generalized weakness. Left eye twitching. Initial encounter. EXAM: CT HEAD WITHOUT CONTRAST TECHNIQUE: Contiguous axial images were obtained from the base of the skull through the vertex without intravenous contrast. COMPARISON:  None. FINDINGS: Brain: No evidence of acute infarction, hemorrhage, hydrocephalus, extra-axial collection or mass lesion/mass effect. The posterior fossa, including the cerebellum, brainstem and fourth ventricle, is within normal limits. The third and lateral ventricles, and basal ganglia are unremarkable in appearance. The cerebral hemispheres are symmetric in appearance, with normal gray-white differentiation. No mass effect or midline shift is seen. Vascular: No hyperdense vessel or unexpected calcification. Skull: There is no evidence of fracture; visualized osseous structures are unremarkable in appearance. Sinuses/Orbits: The visualized portions of the orbits are within normal limits. The paranasal sinuses and mastoid air cells are well-aerated. Other: No significant soft tissue abnormalities are seen. IMPRESSION: Unremarkable noncontrast CT of the head. Electronically Signed   By: Roanna Raider M.D.   On: 07/26/2016 00:16    EKG: Normal sinus rhythm at 57 bpm with normal axis and nonspecific ST-T wave changes.   IMPRESSION AND  PLAN:  This is a 45 y.o. female with a history of anxiety now being admitted with: 1. TIA versus CVA-admit to observation with telemetry for monitoring, neuro checks, MRI/MRA, carotids, echocardiogram and neurology consultation in the morning. We'll check lipids and TSH. We will trend troponins. Continue  aspirin.  2. History of anxiety-advised patient that Xanax is available if she needs but I would prefer that she not take any mind altering medications so as not to interfere with neuro checks.  Diet: Heart healthy  Fluids: IV normal saline DVT Px: Lovenox, SCDs and early ambulation Code Status: Full  All the records are reviewed and case discussed with ED provider. Management plans discussed with the patient and/or family who express understanding and agree with plan of care.   TOTAL TIME TAKING CARE OF THIS PATIENT: 60 minutes.   Neyland Pettengill D.O. on 07/26/2016 at 1:37 AM Between 7am to 6pm - Pager - [phone] After 6pm go to [email or web address] - Social research officer, government [facility] [place] [facility] Office [phone] CC: Primary care physician; Virginia Pitter, MD     Note: This dictation was prepared with Dragon dictation along with smaller phrase technology. Any transcriptional errors that result from this process are unintentional.

## 2016-07-26 NOTE — Progress Notes (Signed)
SLP Cancellation Note  Patient Details Name: Loretta PlumeKeiche C Doble MRN: [id number] DOB: [birth year]   Cancelled treatment:       Reason Eval/Treat Not Completed: SLP screened, no needs identified, will sign off (symptoms resolved per pt; communication wnl)   Watson,Katherine 07/26/2016, 10:58 AM

## 2016-07-26 NOTE — Progress Notes (Signed)
OT Cancellation Note  Patient Details Name: Virginia Griffith MRN: [id number] DOB: [birth year]   Cancelled Treatment:    Reason Eval/Treat Not Completed: Patient at procedure or test/ unavailable  Olegario MessierElaine Kallin Henk, MS, OTR/L 07/26/2016, 9:23 AM

## 2016-07-27 LAB — HEMOGLOBIN A1C
Hgb A1c MFr Bld: 5.7 % — ABNORMAL HIGH (ref 4.8–5.6)
Mean Plasma Glucose: 117 mg/dL

## 2017-03-25 ENCOUNTER — Encounter: Payer: Self-pay | Admitting: Emergency Medicine

## 2017-03-25 ENCOUNTER — Emergency Department
Admission: EM | Admit: 2017-03-25 | Discharge: 2017-03-25 | Disposition: A | Discharge status: Home / Self Care | Payer: BC Managed Care – PPO | Attending: Emergency Medicine | Admitting: Emergency Medicine

## 2017-03-25 DIAGNOSIS — Z91040 Latex allergy status: Secondary | ICD-10-CM | POA: Insufficient documentation

## 2017-03-25 DIAGNOSIS — Z8673 Personal history of transient ischemic attack (TIA), and cerebral infarction without residual deficits: Secondary | ICD-10-CM | POA: Insufficient documentation

## 2017-03-25 DIAGNOSIS — R202 Paresthesia of skin: Secondary | ICD-10-CM | POA: Diagnosis not present

## 2017-03-25 DIAGNOSIS — R002 Palpitations: Secondary | ICD-10-CM | POA: Diagnosis not present

## 2017-03-25 DIAGNOSIS — Z7982 Long term (current) use of aspirin: Secondary | ICD-10-CM | POA: Insufficient documentation

## 2017-03-25 LAB — BASIC METABOLIC PANEL
ANION GAP: 6 (ref 5–15)
BUN: 11 mg/dL (ref 6–20)
CHLORIDE: 105 mmol/L (ref 101–111)
CO2: 27 mmol/L (ref 22–32)
CREATININE: 0.84 mg/dL (ref 0.44–1.00)
Calcium: 9.3 mg/dL (ref 8.9–10.3)
GFR calc non Af Amer: >60 mL/min (ref >60)
Glucose, Bld: 104 mg/dL — ABNORMAL HIGH (ref 65–99)
POTASSIUM: 4.3 mmol/L (ref 3.5–5.1)
SODIUM: 138 mmol/L (ref 135–145)

## 2017-03-25 LAB — URINALYSIS, COMPLETE (UACMP) WITH MICROSCOPIC
BILIRUBIN URINE: NEGATIVE
Bacteria, UA: NONE SEEN
Glucose, UA: NEGATIVE mg/dL
KETONES UR: NEGATIVE mg/dL
LEUKOCYTES UA: NEGATIVE
Nitrite: NEGATIVE
PH: 5 (ref 5.0–8.0)
Protein, ur: NEGATIVE mg/dL
RBC / HPF: NONE SEEN RBC/hpf (ref 0–5)
Specific Gravity, Urine: 1.008 (ref 1.005–1.030)

## 2017-03-25 LAB — CBC
HEMATOCRIT: 43.9 % (ref 35.0–47.0)
HEMOGLOBIN: 14.4 g/dL (ref 12.0–16.0)
MCH: 27.5 pg (ref 26.0–34.0)
MCHC: 32.7 g/dL (ref 32.0–36.0)
MCV: 84.1 fL (ref 80.0–100.0)
PLATELETS: 211 10*3/uL (ref 150–440)
RBC: 5.21 MIL/uL — AB (ref 3.80–5.20)
RDW: 14.6 % — ABNORMAL HIGH (ref 11.5–14.5)
WBC: 4.8 10*3/uL (ref 3.6–11.0)

## 2017-03-25 LAB — POCT PREGNANCY, URINE: Preg Test, Ur: NEGATIVE

## 2017-03-25 LAB — TROPONIN I

## 2017-03-25 NOTE — ED Triage Notes (Signed)
States sensation of palpitations x 2 days. Denies chest pain/.

## 2017-03-25 NOTE — Discharge Instructions (Signed)
Please discuss with your primary care the possibility of having holter monitoring. Please seek medical attention for any high fevers, chest pain, shortness of breath, change in behavior, persistent vomiting, bloody stool or any other new or concerning symptoms.

## 2017-03-25 NOTE — ED Provider Notes (Signed)
[facility] Emergency Department Provider Note  ____________________________________________   I have reviewed the triage vital signs and the nursing notes.   HISTORY  Chief Complaint Palpitations   History limited by: Not Limited   HPI Virginia Griffith is a 46 y.o. female who presents to the emergency department today because of concerns for palpitations. She states they've been on and off starting this weekend. This morning they were worse. She feels like her heart is pounding. She denies any chest pain with this. The time of my exam it had resolved. In addition the patient has been having intermittent issues with tingling and change in sensation throughout her body. This is been going on for weeks. She thinks it is related to her levothyroxine.   Past Medical History:  Diagnosis Date  . Anxiety     Patient Active Problem List   Diagnosis Date Noted  . TIA (transient ischemic attack) 07/26/2016    Past Surgical History:  Procedure Laterality Date  . CESAREAN SECTION      Prior to Admission medications   Medication Sig Start Date End Date Taking? Authorizing Provider  aspirin EC 81 MG EC tablet Take 1 tablet (81 mg total) by mouth daily. 07/27/16   Auburn BilberryPatel, Shreyang, MD  cyclobenzaprine (FLEXERIL) 10 MG tablet Take 1 tablet (10 mg total) by mouth 3 (three) times daily as needed for muscle spasms. Patient not taking: Reported on 07/28/2015 12/15/14   Charlestine NightLawyer, Christopher, PA-C  HYDROcodone-acetaminophen (NORCO/VICODIN) 5-325 MG per tablet Take 1 tablet by mouth every 6 (six) hours as needed for moderate pain. Patient not taking: Reported on 07/28/2015 12/15/14   Charlestine NightLawyer, Christopher, PA-C  ibuprofen (ADVIL,MOTRIN) 800 MG tablet Take 1 tablet (800 mg total) by mouth every 8 (eight) hours as needed. Patient not taking: Reported on 07/28/2015 12/15/14   Charlestine NightLawyer, Christopher, PA-C  metroNIDAZOLE (METROGEL VAGINAL) 0.75 % vaginal gel Place 1 Applicatorful vaginally  daily. Patient not taking: Reported on 09/20/2014 08/07/12   Kirkland HunStringer, Arthur, MD  Naproxen Sodium (ALEVE PO) Take 1-2 tablets by mouth every 12 (twelve) hours as needed (pain).    [provider]    Allergies Latex  No family history on file.  Social History Social History  Substance Use Topics  . Smoking status: Never Smoker  . Smokeless tobacco: Never Used  . Alcohol use Yes     Comment: wine socially     Review of Systems Constitutional: No fever/chills Eyes: No visual changes. ENT: No sore throat. Cardiovascular: Positive for palpitations. Respiratory: Denies shortness of breath. Gastrointestinal: No abdominal pain.  No nausea, no vomiting.  No diarrhea.   Genitourinary: Negative for dysuria. Musculoskeletal: Negative for back pain. Skin: Negative for rash. Neurological: Positive for tingling and numbness.  ____________________________________________   PHYSICAL EXAM:  VITAL SIGNS: ED Triage Vitals  Enc Vitals Group     BP 03/25/17 1046 126/88     Pulse Rate 03/25/17 1046 74     Resp 03/25/17 1046 20     Temp 03/25/17 1046 98.8 F (37.1 C)     Temp Source 03/25/17 1046 Oral     SpO2 03/25/17 1046 100 %     Weight 03/25/17 1047 180 lb (81.6 kg)     Height 03/25/17 1047 5\' 11"  (1.803 m)    Constitutional: Alert and oriented. Well appearing and in no distress. Eyes: Conjunctivae are normal.  ENT   Head: Normocephalic and atraumatic.   Nose: No congestion/rhinnorhea.   Mouth/Throat: Mucous membranes are moist.  Neck: No stridor. Hematological/Lymphatic/Immunilogical: No cervical lymphadenopathy. Cardiovascular: Normal rate, regular rhythm.  No murmurs, rubs, or gallops. Respiratory: Normal respiratory effort without tachypnea nor retractions. Breath sounds are clear and equal bilaterally. No wheezes/rales/rhonchi. Gastrointestinal: Soft and non tender. No rebound. No guarding.  Genitourinary: Deferred Musculoskeletal: Normal range of  motion in all extremities. No lower extremity edema. Neurologic:  Normal speech and language. No gross focal neurologic deficits are appreciated.  Skin:  Skin is warm, dry and intact. No rash noted. Psychiatric: Mood and affect are normal. Speech and behavior are normal. Patient exhibits appropriate insight and judgment.  ____________________________________________    LABS (pertinent positives/negatives)  Labs Reviewed  BASIC METABOLIC PANEL - Abnormal; Notable for the following:       Result Value   Glucose, Bld 104 (*)    All other components within normal limits  CBC - Abnormal; Notable for the following:    RBC 5.21 (*)    RDW 14.6 (*)    All other components within normal limits  URINALYSIS, COMPLETE (UACMP) WITH MICROSCOPIC - Abnormal; Notable for the following:    Color, Urine YELLOW (*)    APPearance CLEAR (*)    Hgb urine dipstick SMALL (*)    Squamous Epithelial / LPF 0-5 (*)    All other components within normal limits  TROPONIN I  POCT PREGNANCY, URINE    ____________________________________________   EKG  I, Phineas Semen, attending physician, personally viewed and interpreted this EKG  EKG Time: 1051 Rate: 69 Rhythm: normal sinus rhythm Axis: normal Intervals: qtc 422 QRS: narrow ST changes: no st elevation Impression: normal ekg   ____________________________________________    RADIOLOGY  None  ____________________________________________   PROCEDURES  Procedures  ____________________________________________   INITIAL IMPRESSION / ASSESSMENT AND PLAN / ED COURSE  Pertinent labs & imaging results that were available during my care of the patient were reviewed by me and considered in my medical decision making (see chart for details).  Patient presented to the emergency department today because of concerns for palpitations. Workup here without concerning findings. I think it could be related to the patient's Levothroid. Discussed  with patient that Holter monitoring as well as rechecking her thyroid levels with primary care.  ____________________________________________   FINAL CLINICAL IMPRESSION(S) / ED DIAGNOSES  Final diagnoses:  Palpitations     Note: This dictation was prepared with Dragon dictation. Any transcriptional errors that result from this process are unintentional     Phineas Semen, MD 03/25/17 1453

## 2017-03-25 NOTE — ED Notes (Signed)
Pt c/o palpitations xfew days with slight SOB, pt denies CP. Pt ambulatory, denies any pain or distress at this time

## 2017-05-22 ENCOUNTER — Emergency Department (HOSPITAL_COMMUNITY): Payer: BC Managed Care – PPO

## 2017-05-22 ENCOUNTER — Encounter (HOSPITAL_COMMUNITY): Payer: Self-pay | Admitting: *Deleted

## 2017-05-22 DIAGNOSIS — R202 Paresthesia of skin: Secondary | ICD-10-CM | POA: Diagnosis not present

## 2017-05-22 DIAGNOSIS — R079 Chest pain, unspecified: Secondary | ICD-10-CM | POA: Diagnosis present

## 2017-05-22 DIAGNOSIS — Z8673 Personal history of transient ischemic attack (TIA), and cerebral infarction without residual deficits: Secondary | ICD-10-CM | POA: Diagnosis not present

## 2017-05-22 DIAGNOSIS — R0789 Other chest pain: Secondary | ICD-10-CM | POA: Insufficient documentation

## 2017-05-22 DIAGNOSIS — Z79899 Other long term (current) drug therapy: Secondary | ICD-10-CM | POA: Insufficient documentation

## 2017-05-22 DIAGNOSIS — R531 Weakness: Secondary | ICD-10-CM | POA: Diagnosis not present

## 2017-05-22 DIAGNOSIS — R200 Anesthesia of skin: Secondary | ICD-10-CM | POA: Insufficient documentation

## 2017-05-22 LAB — CBC
HEMATOCRIT: 41.1 % (ref 36.0–46.0)
Hemoglobin: 13.3 g/dL (ref 12.0–15.0)
MCH: 27.4 pg (ref 26.0–34.0)
MCHC: 32.4 g/dL (ref 30.0–36.0)
MCV: 84.6 fL (ref 78.0–100.0)
Platelets: 277 10*3/uL (ref 150–400)
RBC: 4.86 MIL/uL (ref 3.87–5.11)
RDW: 14.1 % (ref 11.5–15.5)
WBC: 5.4 10*3/uL (ref 4.0–10.5)

## 2017-05-22 LAB — COMPREHENSIVE METABOLIC PANEL
ALT: 10 U/L — ABNORMAL LOW (ref 14–54)
ANION GAP: 8 (ref 5–15)
AST: 21 U/L (ref 15–41)
Albumin: 4 g/dL (ref 3.5–5.0)
Alkaline Phosphatase: 53 U/L (ref 38–126)
BUN: 7 mg/dL (ref 6–20)
CHLORIDE: 104 mmol/L (ref 101–111)
CO2: 25 mmol/L (ref 22–32)
Calcium: 9.2 mg/dL (ref 8.9–10.3)
Creatinine, Ser: 0.94 mg/dL (ref 0.44–1.00)
GFR calc Af Amer: >60 mL/min (ref >60)
Glucose, Bld: 90 mg/dL (ref 65–99)
POTASSIUM: 4.1 mmol/L (ref 3.5–5.1)
Sodium: 137 mmol/L (ref 135–145)
Total Bilirubin: 0.9 mg/dL (ref 0.3–1.2)
Total Protein: 7.3 g/dL (ref 6.5–8.1)

## 2017-05-22 LAB — DIFFERENTIAL
BASOS ABS: 0 10*3/uL (ref 0.0–0.1)
BASOS PCT: 0 %
EOS ABS: 0.2 10*3/uL (ref 0.0–0.7)
EOS PCT: 4 %
Lymphocytes Relative: 31 %
Lymphs Abs: 1.7 10*3/uL (ref 0.7–4.0)
MONOS PCT: 5 %
Monocytes Absolute: 0.3 10*3/uL (ref 0.1–1.0)
NEUTROS PCT: 60 %
Neutro Abs: 3.2 10*3/uL (ref 1.7–7.7)

## 2017-05-22 LAB — PROTIME-INR
INR: 1.02
Prothrombin Time: 13.4 seconds (ref 11.4–15.2)

## 2017-05-22 LAB — I-STAT TROPONIN, ED: Troponin i, poc: 0 ng/mL (ref 0.00–0.08)

## 2017-05-22 LAB — APTT: APTT: 25 s (ref 24–36)

## 2017-05-22 NOTE — ED Triage Notes (Addendum)
Pt reports being at work, onset 1300 had numbness sensation to left side of body and mid chest pains. Initially had sob. Reports the numbness and sob has improved pta. No resp distress is noted at triage. Grips are equal, no arm drift, no facial droop, speech is clear. Had mild headache this am. Reports increased stress at home.

## 2017-05-23 ENCOUNTER — Emergency Department (HOSPITAL_COMMUNITY): Payer: BC Managed Care – PPO

## 2017-05-23 ENCOUNTER — Emergency Department (HOSPITAL_COMMUNITY)
Admission: EM | Admit: 2017-05-23 | Discharge: 2017-05-23 | Disposition: A | Discharge status: Home / Self Care | Payer: BC Managed Care – PPO | Attending: Emergency Medicine | Admitting: Emergency Medicine

## 2017-05-23 DIAGNOSIS — R200 Anesthesia of skin: Secondary | ICD-10-CM | POA: Diagnosis not present

## 2017-05-23 DIAGNOSIS — Z8673 Personal history of transient ischemic attack (TIA), and cerebral infarction without residual deficits: Secondary | ICD-10-CM | POA: Diagnosis not present

## 2017-05-23 DIAGNOSIS — R0789 Other chest pain: Secondary | ICD-10-CM

## 2017-05-23 DIAGNOSIS — Z79899 Other long term (current) drug therapy: Secondary | ICD-10-CM | POA: Diagnosis not present

## 2017-05-23 DIAGNOSIS — R079 Chest pain, unspecified: Secondary | ICD-10-CM | POA: Diagnosis not present

## 2017-05-23 DIAGNOSIS — R202 Paresthesia of skin: Secondary | ICD-10-CM

## 2017-05-23 HISTORY — DX: Disorder of thyroid, unspecified: E07.9

## 2017-05-23 HISTORY — DX: Transient cerebral ischemic attack, unspecified: G45.9

## 2017-05-23 LAB — I-STAT BETA HCG BLOOD, ED (MC, WL, AP ONLY): I-stat hCG, quantitative: <5 m[IU]/mL (ref <5)

## 2017-05-23 LAB — I-STAT TROPONIN, ED: TROPONIN I, POC: 0.01 ng/mL (ref 0.00–0.08)

## 2017-05-23 MED ORDER — ASPIRIN 81 MG PO CHEW
CHEWABLE_TABLET | ORAL | Status: AC
  Filled 2017-05-23: qty 4

## 2017-05-23 MED ORDER — LORAZEPAM 2 MG/ML IJ SOLN
1.0000 mg | Freq: Once | INTRAMUSCULAR | Status: DC
  Filled 2017-05-23: qty 1

## 2017-05-23 MED ORDER — ASPIRIN 81 MG PO CHEW
324.0000 mg | CHEWABLE_TABLET | Freq: Once | ORAL | Status: AC
  Administered 2017-05-23: 324 mg via ORAL

## 2017-05-23 MED ORDER — LORAZEPAM 2 MG/ML IJ SOLN
1.0000 mg | Freq: Once | INTRAMUSCULAR | Status: AC
  Administered 2017-05-23: 1 mg via INTRAMUSCULAR

## 2017-05-23 NOTE — ED Notes (Signed)
Pt remains in MRI at this time  

## 2017-05-23 NOTE — ED Provider Notes (Signed)
TIME SEEN: 12:49 AM  CHIEF COMPLAINT: Chest Pain  By signing my name below, I, Virginia Griffith, attest that this documentation has been prepared under the direction and in the presence of [name], [name], DO. Electronically Signed: [name], ED Scribe. 05/23/17. 2:17 AM.  HPI:  Virginia PlumeKeiche C Balgobin is a 46 y.o. female with a PMHx of HLD, thyroid disease and TIA approximately one year ago and was admitted [place] [facility], who presents to the Emergency Department complaining of sudden onset, persistent, left sided numbness and tingling that began around 12:30 pm. Reports symptoms were severe for about an hour and a half and then have improved but have not completely resolved. She notes associated chest tightness, mild confusion, decreased hearing in her left ear, mild shortness of breath, and light-headedness. Pt reports one year ago she had a TIA and was hospitalized [facility] one [facility]. She endorses experiencing a increasing amount of stress. Denies a cardaic history. At this time she feels tightness in to the left side of her face. She is currently on her menstrual cycle. She denies focal weakness, nausea, vomiting, dizziness, diaphoresis, or headache.   ROS: See HPI Constitutional: no fever or diaphoresis Eyes: no drainage  ENT: no runny nose, decreased hearing   Cardiovascular:  no chest pain, chest tightness Resp: + SOB  GI: no vomiting or nausea  GU: no dysuria Integumentary: no rash  Allergy: no hives  Musculoskeletal: no leg swelling  Neurological: no slurred speech, dizziness, or  headache, +numbness and tingling , + light-headedness ROS otherwise negative  PAST MEDICAL HISTORY/PAST SURGICAL HISTORY:  Past Medical History:  Diagnosis Date  . Anxiety   . Thyroid disease   . TIA (transient ischemic attack)     MEDICATIONS:  Prior to Admission medications   Medication Sig Start Date End Date Taking? Authorizing Provider  aspirin EC 81 MG EC tablet Take 1 tablet (81 mg total)  by mouth daily. 07/27/16   Auburn BilberryPatel, Shreyang, MD  cyclobenzaprine (FLEXERIL) 10 MG tablet Take 1 tablet (10 mg total) by mouth 3 (three) times daily as needed for muscle spasms. Patient not taking: Reported on 07/28/2015 12/15/14   Charlestine NightLawyer, Christopher, PA-C  HYDROcodone-acetaminophen (NORCO/VICODIN) 5-325 MG per tablet Take 1 tablet by mouth every 6 (six) hours as needed for moderate pain. Patient not taking: Reported on 07/28/2015 12/15/14   Charlestine NightLawyer, Christopher, PA-C  ibuprofen (ADVIL,MOTRIN) 800 MG tablet Take 1 tablet (800 mg total) by mouth every 8 (eight) hours as needed. Patient not taking: Reported on 07/28/2015 12/15/14   Charlestine NightLawyer, Christopher, PA-C  metroNIDAZOLE (METROGEL VAGINAL) 0.75 % vaginal gel Place 1 Applicatorful vaginally daily. Patient not taking: Reported on 09/20/2014 08/07/12   Kirkland HunStringer, Arthur, MD  Naproxen Sodium (ALEVE PO) Take 1-2 tablets by mouth every 12 (twelve) hours as needed (pain).    [provider]    ALLERGIES:  Allergies  Allergen Reactions  . Latex Rash    SOCIAL HISTORY:  Social History  Substance Use Topics  . Smoking status: Never Smoker  . Smokeless tobacco: Never Used  . Alcohol use Yes     Comment: wine socially     FAMILY HISTORY: History reviewed. No pertinent family history.  EXAM: BP 136/81 (BP Location: Right Arm)   Pulse (!) 59   Temp 97.9 F (36.6 C) (Oral)   Resp 18   LMP 05/22/2017   SpO2 100%  CONSTITUTIONAL: Alert and oriented and responds appropriately to questions. Well-appearing; well-nourished HEAD: Normocephalic EYES: Conjunctivae clear, PERRL ENT:  normal nose; no rhinorrhea; moist mucous membranes; pharynx without lesions noted NECK: Supple, no meningismus, no LAD  CARD: RRR; S1 and S2 appreciated; no murmurs, no clicks, no rubs, no gallops RESP: Normal chest excursion without splinting or tachypnea; breath sounds clear and equal bilaterally; no wheezes, no rhonchi, no rales, Speaking full sentences, no hypoxia,  no respiratory distress ABD/GI: Normal bowel sounds; non-distended; soft, non-tender, no rebound, no guarding BACK:  The back appears normal and is non-tender to palpation, there is no CVA tenderness EXT: Normal ROM in all joints; non-tender to palpation; no edema; normal capillary refill; no cyanosis    SKIN: Normal color for age and race; warm NEURO: Moves all extremities equally, reports diminished sensation to the left face and arm and leg compared to the right, strength 5/5 in all function studies, cranial nerves II through XII intact, normal vision, normal speech PSYCH: The patient's mood and manner are appropriate. Grooming and personal hygiene are appropriate.  MEDICAL DECISION MAKING: Patient here with complaints of chest pressure and shortness of breath that have resolved. She has had 2 negative troponins. Will add on a chest x-ray. EKG shows sinus bradycardia with no other abnormality. Also having left-sided numbness. Still having some mild numbness on exam. Symptoms started at 12:30 PM yesterday. She is outside of TPA window. I'm not concerned for large vessel occlusion. She has no weakness, aphasia, vision changes at this time. CT of her head and obtained in triage is unremarkable. We'll give aspirin and discuss with neurology further recommendations.  ED PROGRESS:   1:44 AM  D/w Dr. Wilford Corner with neurology. He agrees with obtaining MRI of patient's brain without contrast rule out stroke given she is currently having symptoms. Patient comfortable with this plan.  5:10 AM  Pt's brain MRI shows no acute abnormality. No acute stroke visualized. No mass. No bleeding. I feel she is safe to be discharged home and follow-up with outpatient PCP and neurology as needed. Patient is comfortable with this plan.   At this time, I do not feel there is any life-threatening condition present. I have reviewed and discussed all results (EKG, imaging, lab, urine as appropriate) and exam findings with  patient/family. I have reviewed nursing notes and appropriate previous records.  I feel the patient is safe to be discharged home without further emergent workup and can continue workup as an outpatient as needed. Discussed usual and customary return precautions. Patient/family verbalize understanding and are comfortable with this plan.  Outpatient follow-up has been provided if needed. All questions have been answered.   EKG Interpretation  Date/Time:  Wednesday May 22 2017 13:31:22 EDT Ventricular Rate:  58 PR Interval:  138 QRS Duration: 80 QT Interval:  410 QTC Calculation: 402 R Axis:   53 Text Interpretation:  Sinus bradycardia Otherwise normal ECG Confirmed by Rochele Raring [phone]) on 05/23/2017 12:46:01 AM        I personally performed the services described in this documentation, which was scribed in my presence. The recorded information has been reviewed and is accurate.      Akayla Brass, Layla Maw, DO 05/23/17 [phone]

## 2017-06-21 MED ORDER — IPRATROPIUM-ALBUTEROL 0.5-2.5 (3) MG/3ML IN SOLN
RESPIRATORY_TRACT | Status: AC
  Filled 2017-06-21: qty 3

## 2018-04-14 IMAGING — MR MR HEAD W/O CM
9 of 10 series · 35 of 48 positions shown · non-contrast
Comparison: CT HEAD May 22, 2017 and MRI of the brain July 26, 2016

CLINICAL DATA: Persistent LEFT numbness and tingling beginning at
[DATE] p.m., improved. TIA 1 year ago.

EXAM:
MRI HEAD WITHOUT CONTRAST
TECHNIQUE: Multiplanar, multiecho pulse sequences of the brain and surrounding
structures were obtained without intravenous contrast.

[Series 4: DWI · axial · 3.0mm · 0.94mm/px · z∈[-49,+86]mm · 8 of 100 slices shown (1 of 2)]
[im 1/100]
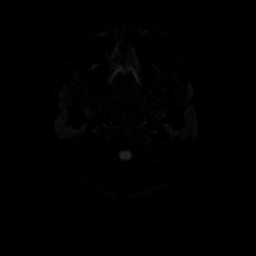
[im 12/100]
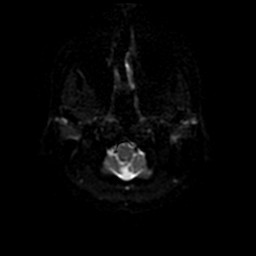
[im 34/100]
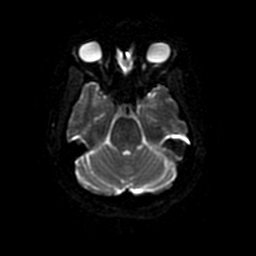
[im 45/100]
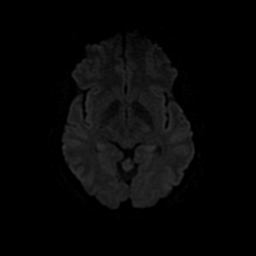
[im 56/100]
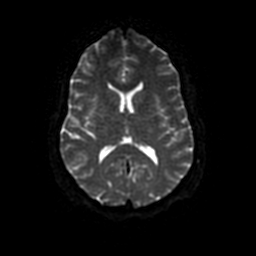
[im 67/100]
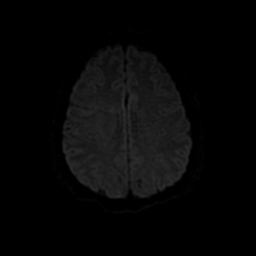
[im 89/100]
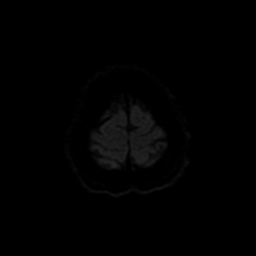
[im 100/100]
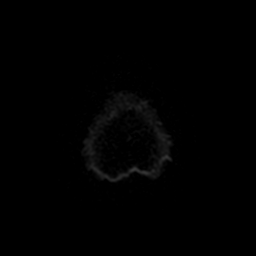

[Series 5: DWI · coronal · 4.0mm · 0.94mm/px · 8 of 72 slices shown (2 of 2)]
[im 1/72]
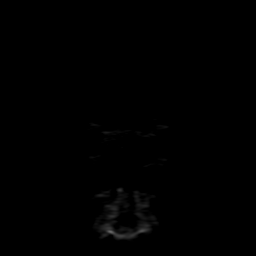
[im 11/72]
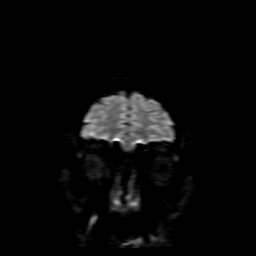
[im 21/72]
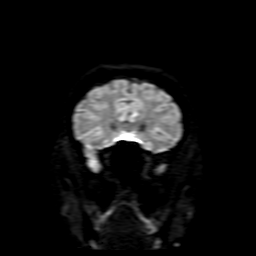
[im 31/72]
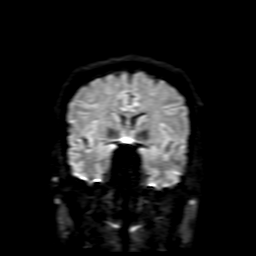
[im 41/72]
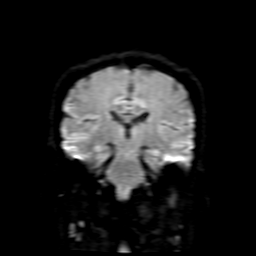
[im 51/72]
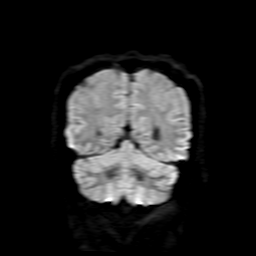
[im 61/72]
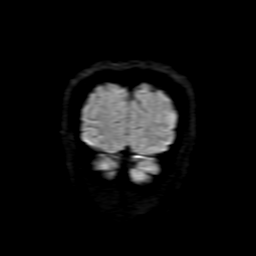
[im 72/72]
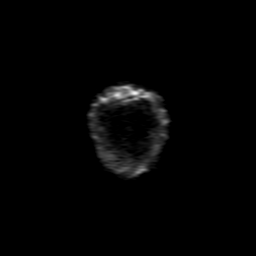

[Series 6: FLAIR · sagittal · 5.0mm · 0.47mm/px · 2 of 23 slices shown (1 of 2)]
[im 1/23]
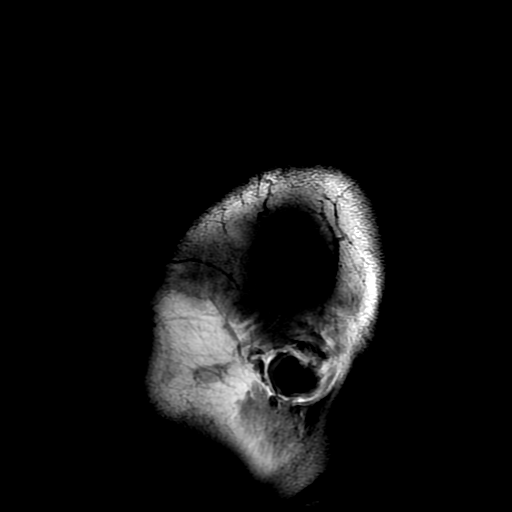
[im 23/23]
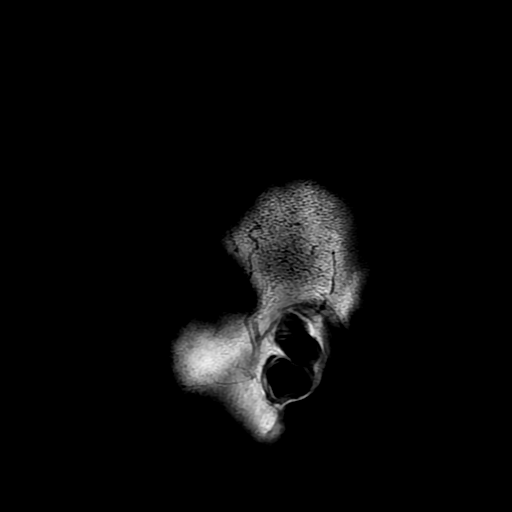

[Series 7: T2 · axial · 5.0mm · 0.43mm/px · z∈[-44,+88]mm · 2 of 25 slices shown (1 of 2)]
[im 1/25]
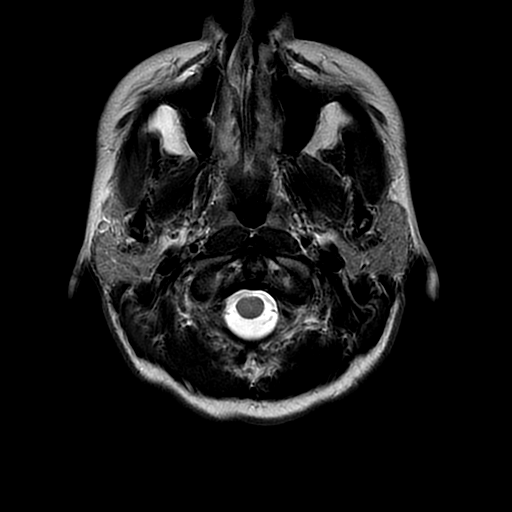
[im 25/25]
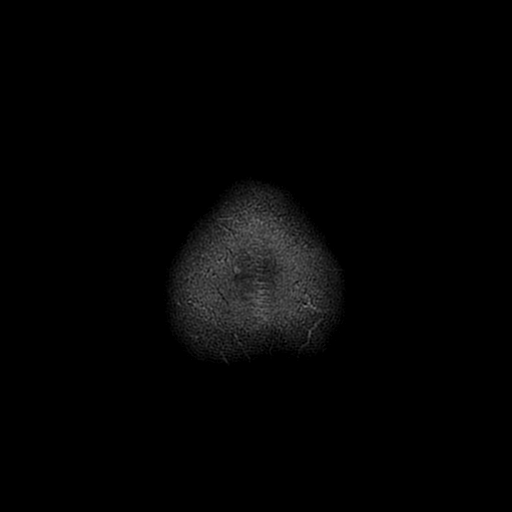

[Series 8: FLAIR · axial · 3.0mm · 0.41mm/px · z∈[-37,+77]mm · 3 of 30 slices shown (2 of 2)]
[im 1/30]
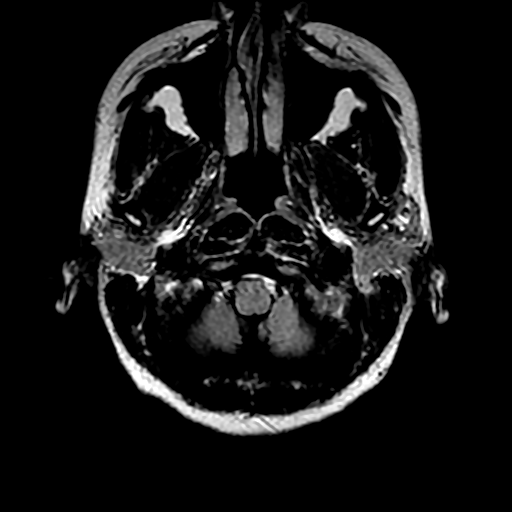
[im 15/30]
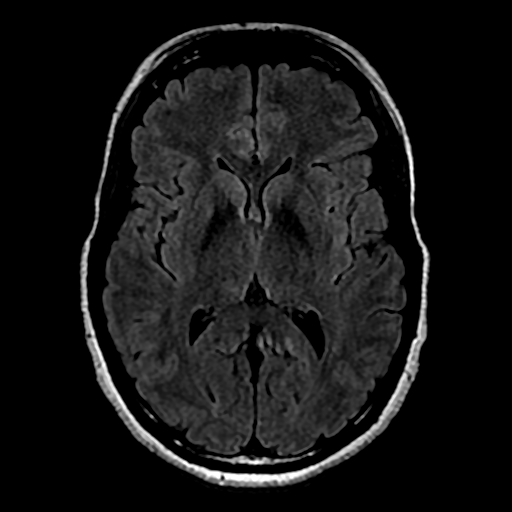
[im 30/30]
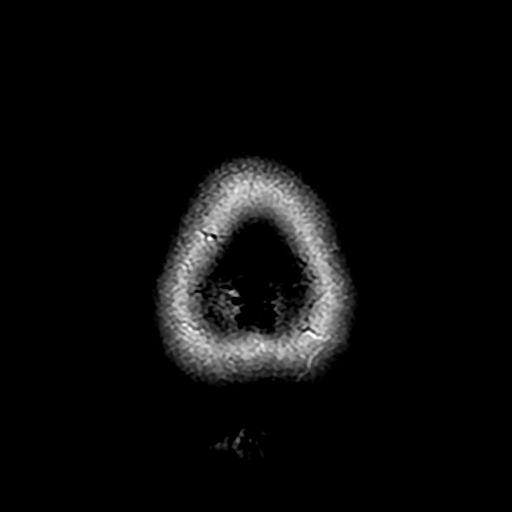

[Series 9: (person_name) · axial · 3.0mm · 0.47mm/px · z∈[-49,-32]mm · 2 of 100 slices shown]
[im 1/100]
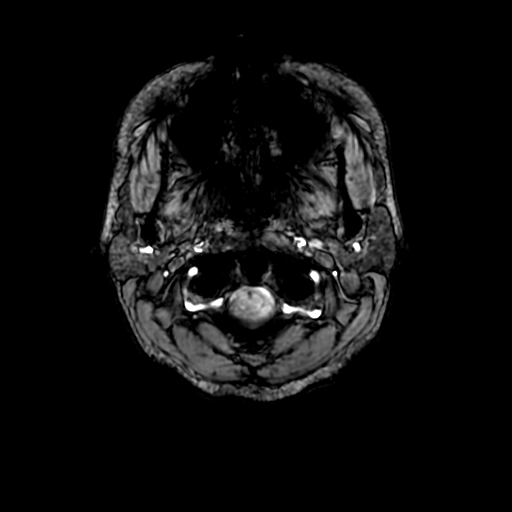
[im 13/100]
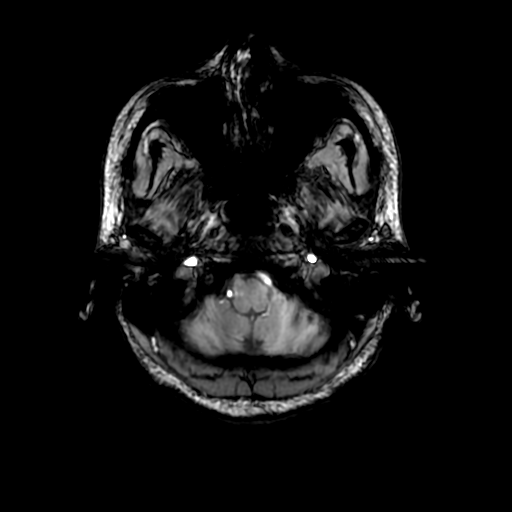

[Series 11: T2 · coronal · 5.0mm · 0.39mm/px · 3 of 30 slices shown (2 of 2)]
[im 1/30]
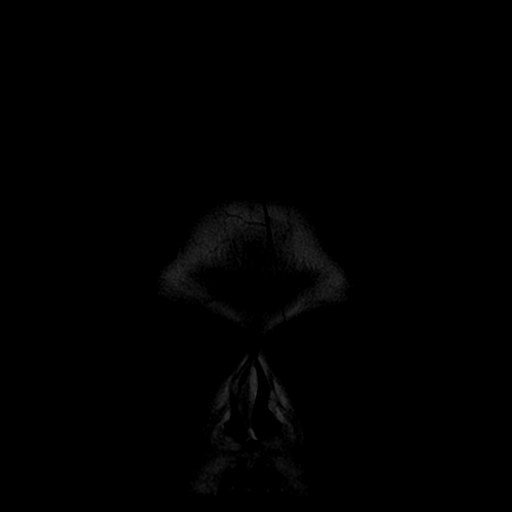
[im 15/30]
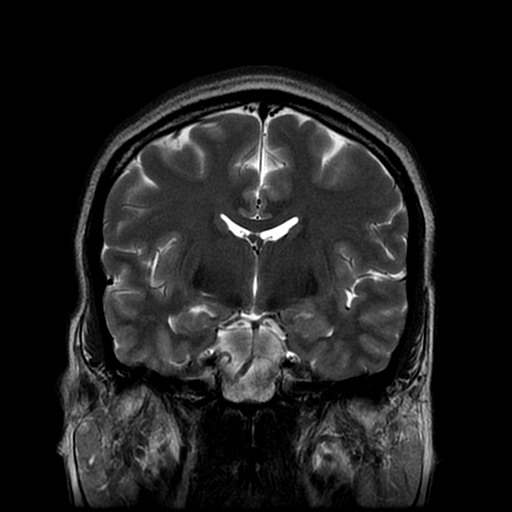
[im 30/30]
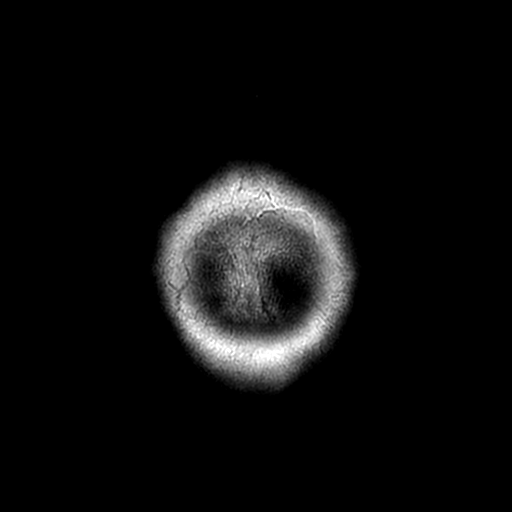

[Series 450: ADC · axial · 3.0mm · 0.94mm/px · z∈[-49,+86]mm · 4 of 48 slices shown (1 of 2)]
[im 1/48]
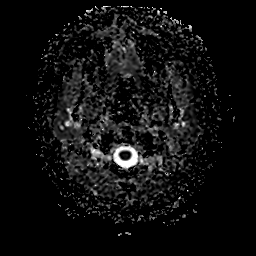
[im 16/48]
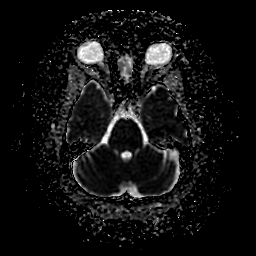
[im 32/48]
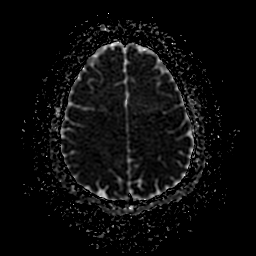
[im 48/48]
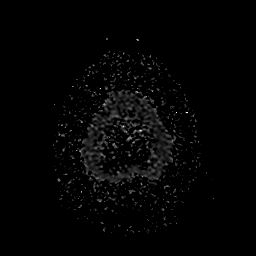

[Series 550: ADC · coronal · 4.0mm · 0.94mm/px · 3 of 36 slices shown (2 of 2)]
[im 1/36]
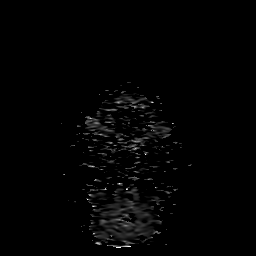
[im 18/36]
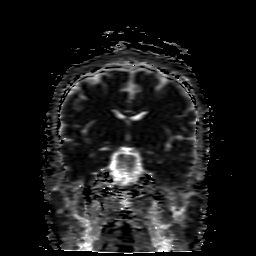
[im 36/36]
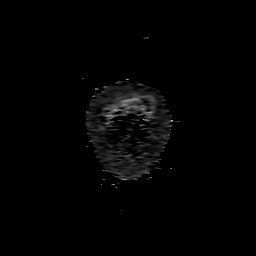

[35 of 48 positions shown; findings below may reference images not displayed]

FINDINGS: BRAIN: No reduced diffusion to suggest acute ischemia. No
susceptibility artifact to suggest hemorrhage. The ventricles and
sulci are normal for patient's age. No suspicious parenchymal
signal, mass or mass effect. No abnormal extra-axial fluid
collections.

VASCULAR: Normal major intracranial vascular flow voids present at
skull base.

SKULL AND UPPER CERVICAL SPINE: No abnormal sellar expansion. No
suspicious calvarial bone marrow signal. Craniocervical junction
maintained.

SINUSES/ORBITS: The mastoid air-cells and included paranasal sinuses
are well-aerated. The included ocular globes and orbital contents
are non-suspicious.

OTHER: None.
IMPRESSION: Stable normal noncontrast MRI head.

## 2020-04-19 DIAGNOSIS — Z20822 Contact with and (suspected) exposure to covid-19: Secondary | ICD-10-CM | POA: Diagnosis not present

## 2023-01-30 ENCOUNTER — Encounter: Payer: Self-pay | Admitting: Emergency Medicine

## 2023-01-30 ENCOUNTER — Emergency Department: Payer: BC Managed Care – PPO

## 2023-01-30 ENCOUNTER — Emergency Department
Admission: EM | Admit: 2023-01-30 | Discharge: 2023-01-30 | Disposition: A | Discharge status: Home / Self Care | Payer: BC Managed Care – PPO | Attending: Emergency Medicine | Admitting: Emergency Medicine

## 2023-01-30 ENCOUNTER — Other Ambulatory Visit: Payer: Self-pay

## 2023-01-30 DIAGNOSIS — R791 Abnormal coagulation profile: Secondary | ICD-10-CM | POA: Insufficient documentation

## 2023-01-30 DIAGNOSIS — R200 Anesthesia of skin: Secondary | ICD-10-CM | POA: Diagnosis present

## 2023-01-30 LAB — COMPREHENSIVE METABOLIC PANEL
ALT: 12 U/L (ref 0–44)
AST: 22 U/L (ref 15–41)
Albumin: 4.1 g/dL (ref 3.5–5.0)
Alkaline Phosphatase: 61 U/L (ref 38–126)
Anion gap: 8 (ref 5–15)
BUN: 12 mg/dL (ref 6–20)
CO2: 26 mmol/L (ref 22–32)
Calcium: 9.1 mg/dL (ref 8.9–10.3)
Chloride: 103 mmol/L (ref 98–111)
Creatinine, Ser: 0.93 mg/dL (ref 0.44–1.00)
GFR, Estimated: >60 mL/min (ref >60)
Glucose, Bld: 105 mg/dL — ABNORMAL HIGH (ref 70–99)
Potassium: 3.3 mmol/L — ABNORMAL LOW (ref 3.5–5.1)
Sodium: 137 mmol/L (ref 135–145)
Total Bilirubin: 1.1 mg/dL (ref 0.3–1.2)
Total Protein: 7.8 g/dL (ref 6.5–8.1)

## 2023-01-30 LAB — CBC
HCT: 43.2 % (ref 36.0–46.0)
Hemoglobin: 13.6 g/dL (ref 12.0–15.0)
MCH: 26.5 pg (ref 26.0–34.0)
MCHC: 31.5 g/dL (ref 30.0–36.0)
MCV: 84.2 fL (ref 80.0–100.0)
Platelets: 256 10*3/uL (ref 150–400)
RBC: 5.13 MIL/uL — ABNORMAL HIGH (ref 3.87–5.11)
RDW: 13.1 % (ref 11.5–15.5)
WBC: 3.3 10*3/uL — ABNORMAL LOW (ref 4.0–10.5)
nRBC: 0 % (ref 0.0–0.2)

## 2023-01-30 LAB — DIFFERENTIAL
Abs Immature Granulocytes: 0.01 10*3/uL (ref 0.00–0.07)
Basophils Absolute: 0 10*3/uL (ref 0.0–0.1)
Basophils Relative: 0 %
Eosinophils Absolute: 0.1 10*3/uL (ref 0.0–0.5)
Eosinophils Relative: 3 %
Immature Granulocytes: 0 %
Lymphocytes Relative: 41 %
Lymphs Abs: 1.4 10*3/uL (ref 0.7–4.0)
Monocytes Absolute: 0.3 10*3/uL (ref 0.1–1.0)
Monocytes Relative: 8 %
Neutro Abs: 1.5 10*3/uL — ABNORMAL LOW (ref 1.7–7.7)
Neutrophils Relative %: 48 %

## 2023-01-30 LAB — PROTIME-INR
INR: 1.1 (ref 0.8–1.2)
Prothrombin Time: 13.8 seconds (ref 11.4–15.2)

## 2023-01-30 LAB — APTT: aPTT: 28 seconds (ref 24–36)

## 2023-01-30 LAB — ETHANOL: Alcohol, Ethyl (B): <10 mg/dL (ref <10)

## 2023-01-30 NOTE — ED Triage Notes (Signed)
Pt via POV from home. Pt c/o L sided numbness when she was walking the dog this AM states around 0800, states that the symptoms have now subsided but states that she has a hx of TIA. Pt is A&OX4 and NAD

## 2023-01-30 NOTE — Discharge Instructions (Signed)
Your MRI was normal.

## 2023-01-30 NOTE — ED Provider Triage Note (Signed)
Emergency Medicine Provider Triage Evaluation Note  Virginia Griffith , a 52 y.o. female  was evaluated in triage.  Pt complains of left-sided hand weakness while walking the dog's morning.  Patient has a history of TIAs.  She also complains of left-sided facial numbness that is now resolved.  No visual changes..  Review of Systems  Positive: Left-sided weakness resolved Negative: Negative visual changes, headache, no facial numbness.  Physical Exam  BP 135/88 (BP Location: Right Arm)   Pulse 72   Temp 97.8 F (36.6 C)   Resp 18   Ht  (1.803 m)   Wt 87.5 kg   SpO2 100%   BMI 26.92 kg/m  Gen:   Awake, no distress alert, talkative in complete sentences without any difficulty. Resp:  Normal effort lungs are clear bilaterally MSK:   Moves extremities without difficulty.  Good muscle strength 5/5 bilaterally Other:  Cranial nerves II through XII grossly intact.  Speech is normal.  Medical Decision Making  Medically screening exam initiated at 9:02 AM.  Appropriate orders placed.  Loretta Plume was informed that the remainder of the evaluation will be completed by another provider, this initial triage assessment does not replace that evaluation, and the importance of remaining in the ED until their evaluation is complete.     Tommi Rumps, PA-C 01/30/23 [phone]

## 2023-02-04 NOTE — ED Provider Notes (Signed)
[facility] Provider Note    Event Date/Time   First MD Initiated Contact with Patient 01/30/23 1114     (approximate)   History   Numbness   HPI  Virginia Griffith is a 52 y.o. female who presents with complaints of hand numbness while walking the dog this morning, she reports symptoms have been improved significantly.  She is not sure if she had any weakness.  She reports this has happened in the past and she was told it could have been a TIA.  Currently she feels well.  No headaches or other neurodeficits.     Physical Exam   Triage Vital Signs: ED Triage Vitals  Enc Vitals Group     BP 01/30/23 0849 135/88     Pulse Rate 01/30/23 0849 72     Resp 01/30/23 0849 18     Temp 01/30/23 0849 97.8 F (36.6 C)     Temp src --      SpO2 01/30/23 0849 100 %     Weight 01/30/23 0851 87.5 kg (193 lb)     Height 01/30/23 0851 1.803 m ( )     Head Circumference --      Peak Flow --      Pain Score 01/30/23 0851 0     Pain Loc --      Pain Edu? --      Excl. in GC? --     Most recent vital signs: Vitals:   01/30/23 0849  BP: 135/88  Pulse: 72  Resp: 18  Temp: 97.8 F (36.6 C)  SpO2: 100%     General: Awake, no distress.  CV:  Good peripheral perfusion.  Resp:  Normal effort.  Abd:  No distention.  Other:  Cranial nerves II through XII are normal, ambulating well, reassuring exam.  NIH stroke scale 0    ED Results / Procedures / Treatments   Labs (all labs ordered are listed, but only abnormal results are displayed) Labs Reviewed  CBC - Abnormal; Notable for the following components:      Result Value   WBC 3.3 (*)    RBC 5.13 (*)    All other components within normal limits  DIFFERENTIAL - Abnormal; Notable for the following components:   Neutro Abs 1.5 (*)    All other components within normal limits  COMPREHENSIVE METABOLIC PANEL - Abnormal; Notable for the following components:   Potassium 3.3 (*)    Glucose, Bld 105 (*)     All other components within normal limits  PROTIME-INR  APTT  ETHANOL     EKG  ED ECG REPORT I, Jene Every, the attending physician, personally viewed and interpreted this ECG.  Date: 02/04/2023  Rhythm: normal sinus rhythm QRS Axis: normal Intervals: normal ST/T Wave abnormalities: normal Narrative Interpretation: no evidence of acute ischemia    RADIOLOGY CT head viewed interpret by me, no acute  My brain without evidence of stroke    PROCEDURES:  Critical Care performed:   Procedures   MEDICATIONS ORDERED IN ED: Medications - No data to display   IMPRESSION / MDM / ASSESSMENT AND PLAN / ED COURSE  I reviewed the triage vital signs and the nursing notes. Patient's presentation is most consistent with acute presentation with potential threat to life or bodily function.  Patient presents with symptoms of numbness in the hand and possible weakness.  Symptoms have entirely improved, not a candidate for TNK.  Question TIA versus CVA.  This has happened in the past and she has had reassuring workup, may be as simple as a neuropathy or radiculopathy.  CT scan and labs are reassuring, will send for MRI brain  MRI brain is negative for stroke, patient remains well-appearing with normal vitals, no indication for admission at this time, outpatient follow-up with neurology for further workup, return precautions discussed, patient and spouse agree with this plan        FINAL CLINICAL IMPRESSION(S) / ED DIAGNOSES   Final diagnoses:  Numbness     Rx / DC Orders   ED Discharge Orders     None        Note:  This document was prepared using Dragon voice recognition software and may include unintentional dictation errors.   Jene Every, MD 02/04/23 1355

## 2024-06-02 ENCOUNTER — Telehealth: Payer: Self-pay | Admitting: *Deleted

## 2024-06-02 NOTE — Telephone Encounter (Signed)
 LMOVM to verify card hx.

## 2024-06-07 DIAGNOSIS — E039 Hypothyroidism, unspecified: Secondary | ICD-10-CM | POA: Insufficient documentation

## 2024-06-07 DIAGNOSIS — E785 Hyperlipidemia, unspecified: Secondary | ICD-10-CM | POA: Insufficient documentation

## 2024-06-07 DIAGNOSIS — E852 Heredofamilial amyloidosis, unspecified: Secondary | ICD-10-CM | POA: Insufficient documentation

## 2024-06-07 DIAGNOSIS — R7303 Prediabetes: Secondary | ICD-10-CM | POA: Insufficient documentation

## 2024-06-07 NOTE — Progress Notes (Unsigned)
 Cardiology Office Note  Date:  06/08/2024   ID:  Kyanne, Rials 24-Aug-1971, MRN [id number]  PCP:  [facility]   Chief Complaint  Patient presents with   New Patient (Initial Visit)    Dr. Lonell Collet to discuss results of TTR gene, family history of amyloidosis.        HPI:  Paytyn Mesta Harrisis a 53 y.o. femalewith past medical history of: Prediabetes Hypothyroidism Who presents by referral from Dr. 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 Her paternal aunt was diagnosis with cardiac amyloidosis.  She was told that her father may have had the condition.  Patient has seen her genetic counselor  TTR  [id number]  [phone])  On discussion, she is asymptomatic, no shortness of breath no chest pain no near-syncope or syncope She has 3 children  Echocardiogram October 2017 Essentially normal study  Lab work reviewed  total cholesterol 203 down from 252 LDL 139 down from 164 A1c 5.9 down from 6.3  Family History: Father: deceased, Died in his 12's from CHF. Mother: deceased, Died in her 59's, Multiple Sclerosis. 2 brother(s) , 3 sister(s) - healthy.   EKG personally reviewed by myself on todays visit Normal sinus rhythm rate 68 bpm no significant ST or T wave changes  PMH:   has a past medical history of Anxiety, Thyroid disease, and TIA (transient ischemic attack).  PSH:    Past Surgical History:  Procedure Laterality Date   CESAREAN SECTION      Current Outpatient Medications  Medication Sig Dispense Refill   BLACK CURRANT SEED OIL PO Take by mouth daily.     cholecalciferol (VITAMIN D3) 25 MCG (1000 UNIT) tablet Take 1,000 Units by mouth daily.     Ginkgo Biloba 40 MG TABS Take by mouth daily.     TURMERIC PO Take by mouth daily.     levothyroxine (SYNTHROID) 25 MCG tablet Take 25 mcg by mouth daily before breakfast.     No current  facility-administered medications for this visit.    Allergies:   Levothyroxine sodium and Latex   Social History:  The patient  reports that she has never smoked. She has never used smokeless tobacco. She reports current alcohol use. She reports that she does not use drugs.   Family History:   family history is not on file.    Review of Systems: Review of Systems  Constitutional: Negative.   HENT: Negative.    Respiratory: Negative.    Cardiovascular: Negative.   Gastrointestinal: Negative.   Musculoskeletal: Negative.   Neurological: Negative.   Psychiatric/Behavioral: Negative.    All other systems reviewed and are negative.   PHYSICAL EXAM: VS:  BP 120/80 (BP Location: Right Arm, Patient Position: Sitting, Cuff Size: Normal)   Ht 5' 11 (1.803 m)   Wt 179 lb (81.2 kg)   SpO2 98%   BMI 24.97 kg/m  , BMI Body mass index is 24.97 kg/m. GEN: Well nourished, well developed, in no acute distress HEENT: normal Neck: no JVD, carotid bruits, or masses Cardiac: RRR; no murmurs, rubs, or gallops,no edema  Respiratory:  clear to auscultation bilaterally, normal work of breathing GI: soft, nontender, nondistended, + BS MS: no deformity or atrophy Skin: warm and dry, no rash Neuro:  Strength and sensation are intact Psych: euthymic mood, full affect  Recent Labs: No results found for requested labs within last 365  days.    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 - She is going to get the information detailed above before talking with her children - She has already talked with genetic counselor  Patient seen by referral from Dr. Lonell Collet and will be referred back to her office for ongoing care of the issues detailed above  Signed, Velinda Lunger, M.D., Ph.D. [facility] [place], [place] [phone]

## 2024-06-08 ENCOUNTER — Encounter: Payer: Self-pay | Admitting: Cardiovascular Disease

## 2024-06-08 ENCOUNTER — Ambulatory Visit: Attending: Cardiovascular Disease | Admitting: Cardiovascular Disease

## 2024-06-08 VITALS — BP 120/80 | HR 68 | Ht 71.0 in | Wt 179.0 lb

## 2024-06-08 DIAGNOSIS — E782 Mixed hyperlipidemia: Secondary | ICD-10-CM

## 2024-06-08 DIAGNOSIS — R7303 Prediabetes: Secondary | ICD-10-CM | POA: Diagnosis not present

## 2024-06-08 DIAGNOSIS — E852 Heredofamilial amyloidosis, unspecified: Secondary | ICD-10-CM

## 2024-06-08 DIAGNOSIS — E039 Hypothyroidism, unspecified: Secondary | ICD-10-CM

## 2024-06-08 NOTE — Patient Instructions (Addendum)
 Positive genetic test for an amyloidosis gene, such as a mutation in the TTR gene, indicates that you are at an increased risk of developing hereditary amyloidosis. However, it's important to understand that a positive genetic test alone does not necessarily mean you have the disease. It means you are a carrier of the mutation.  50% chance of passing the mutation to your children.   The disease has variable penetrance, meaning that not everyone who carries the mutation will develop the disease, and the age of onset and severity can vary greatly. Regular monitoring and early intervention are key to managing the condition and maintaining a good quality of life.   Medication Instructions:  No changes  If you need a refill on your cardiac medications before your next appointment, please call your pharmacy.   Lab work: Your provider would like for you to have following labs drawn today CBC.    Testing/Procedures:   You are scheduled for Cardiac MRI at the location below.  Please arrive for your appointment at ______________ . ?  [facility] [address] [place], [place] [address] Please take advantage of the free valet parking available at the [facility] and [employer] (Entrance C).  Proceed to the [facility] Radiology Department (First Floor) for check-in.   OR   [facility] [address] [place], [place] [address] Please go to the [facility] and check-in with the desk attendant.   Magnetic resonance imaging (MRI) is a painless test that produces images of the inside of the body without using Xrays.  During an MRI, strong magnets and radio waves work together in a Data processing manager to form detailed images.   MRI images may provide more details about a medical condition than X-rays, CT scans, and ultrasounds can provide.  You may be given earphones to listen for instructions.  You may eat a light breakfast and take medications as  ordered with the exception of furosemide, hydrochlorothiazide, chlorthalidone or spironolactone (or any other fluid pill). If you are undergoing a stress MRI, please avoid stimulants for 12 hr prior to test. (I.e. Caffeine, nicotine, chocolate, or antihistamine medications)  If your provider has ordered anti-anxiety medications for this test, then you will need a driver.  An IV will be inserted into one of your veins. Contrast material will be injected into your IV. It will leave your body through your urine within a day. You may be told to drink plenty of fluids to help flush the contrast material out of your system.  You will be asked to remove all metal, including: Watch, jewelry, and other metal objects including hearing aids, hair pieces and dentures. Also wearable glucose monitoring systems (ie. Freestyle Libre and Omnipods) (Braces and fillings normally are not a problem.)   TEST WILL TAKE APPROXIMATELY 1 HOUR  PLEASE NOTIFY SCHEDULING AT LEAST 24 HOURS IN ADVANCE IF YOU ARE UNABLE TO KEEP YOUR APPOINTMENT. [phone]  For more information and frequently asked questions, please visit our website : [email or web address]  Please call the Cardiac Imaging Nurse Navigators with any questions/concerns. [phone] Office    Follow-Up: At [facility], you and your health needs are our priority.  As part of our continuing mission to provide you with exceptional heart care, we have created designated Provider Care Teams.  These Care Teams include your primary Cardiologist (physician) and Advanced Practice Providers (APPs -  Physician Assistants and Nurse Practitioners) who all work together to provide you with the care you need,  when you need it.  You will need a follow up appointment as needed  Providers on your designated Care Team:   Lonni Meager, NP Bernardino Bring, PA-C Cadence [place], [place]  COVID-19 Vaccine Information can be found at:  [email or web address] For questions related to vaccine distribution or appointments, please email [email or web address] or call [phone].

## 2024-06-09 ENCOUNTER — Ambulatory Visit: Payer: Self-pay | Admitting: Cardiovascular Disease

## 2024-06-09 LAB — CBC
Hematocrit: 40.6 % (ref 34.0–46.6)
Hemoglobin: 13.1 g/dL (ref 11.1–15.9)
MCH: 25.9 pg — ABNORMAL LOW (ref 26.6–33.0)
MCHC: 32.3 g/dL (ref 31.5–35.7)
MCV: 80 fL (ref 79–97)
Platelets: 268 x10E3/uL (ref 150–450)
RBC: 5.05 x10E6/uL (ref 3.77–5.28)
RDW: 13.6 % (ref 11.7–15.4)
WBC: 3.7 x10E3/uL (ref 3.4–10.8)

## 2024-07-07 ENCOUNTER — Encounter (HOSPITAL_COMMUNITY): Payer: Self-pay

## 2024-07-08 ENCOUNTER — Ambulatory Visit
Admission: RE | Admit: 2024-07-08 | Discharge: 2024-07-08 | Disposition: A | Discharge status: Home / Self Care | Source: Ambulatory Visit | Attending: Cardiovascular Disease | Admitting: Cardiovascular Disease

## 2024-07-08 DIAGNOSIS — E852 Heredofamilial amyloidosis, unspecified: Secondary | ICD-10-CM
